# Patient Record
Sex: Female | Born: 1965
Health system: Southern US, Community
[De-identification: ages and names within clinical notes are randomized; demographics above are authoritative.]

## PROBLEM LIST (undated history)

## (undated) DIAGNOSIS — I10 Essential (primary) hypertension: Secondary | ICD-10-CM

## (undated) HISTORY — PX: TONSILLECTOMY: SUR1361

## (undated) HISTORY — DX: Essential (primary) hypertension: I10

## (undated) HISTORY — PX: ELBOW SURGERY: SHX618

---

## 2001-03-01 ENCOUNTER — Other Ambulatory Visit: Admission: RE | Admit: 2001-03-01 | Discharge: 2001-03-01 | Payer: Self-pay | Admitting: Obstetrics and Gynecology

## 2001-03-04 ENCOUNTER — Encounter: Payer: Self-pay | Admitting: Obstetrics and Gynecology

## 2001-03-04 ENCOUNTER — Ambulatory Visit (HOSPITAL_COMMUNITY): Admission: RE | Admit: 2001-03-04 | Discharge: 2001-03-04 | Payer: Self-pay | Admitting: Obstetrics and Gynecology

## 2002-03-04 ENCOUNTER — Ambulatory Visit (HOSPITAL_COMMUNITY): Admission: RE | Admit: 2002-03-04 | Discharge: 2002-03-04 | Payer: Self-pay | Admitting: Obstetrics and Gynecology

## 2002-03-04 ENCOUNTER — Encounter: Payer: Self-pay | Admitting: Obstetrics and Gynecology

## 2002-12-13 ENCOUNTER — Ambulatory Visit (HOSPITAL_COMMUNITY): Admission: RE | Admit: 2002-12-13 | Discharge: 2002-12-13 | Payer: Self-pay | Admitting: Family Medicine

## 2002-12-14 ENCOUNTER — Ambulatory Visit (HOSPITAL_COMMUNITY): Admission: RE | Admit: 2002-12-14 | Discharge: 2002-12-14 | Payer: Self-pay | Admitting: Family Medicine

## 2002-12-21 ENCOUNTER — Ambulatory Visit (HOSPITAL_COMMUNITY): Admission: RE | Admit: 2002-12-21 | Discharge: 2002-12-21 | Payer: Self-pay | Admitting: Family Medicine

## 2004-10-18 ENCOUNTER — Ambulatory Visit (HOSPITAL_COMMUNITY): Admission: RE | Admit: 2004-10-18 | Discharge: 2004-10-18 | Payer: Self-pay | Admitting: Obstetrics and Gynecology

## 2005-10-23 ENCOUNTER — Ambulatory Visit (HOSPITAL_COMMUNITY): Admission: RE | Admit: 2005-10-23 | Discharge: 2005-10-23 | Payer: Self-pay | Admitting: Family Medicine

## 2006-10-01 ENCOUNTER — Ambulatory Visit (HOSPITAL_COMMUNITY): Admission: RE | Admit: 2006-10-01 | Discharge: 2006-10-01 | Payer: Self-pay | Admitting: Family Medicine

## 2006-10-26 ENCOUNTER — Ambulatory Visit (HOSPITAL_COMMUNITY): Admission: RE | Admit: 2006-10-26 | Discharge: 2006-10-26 | Payer: Self-pay | Admitting: Obstetrics and Gynecology

## 2007-10-27 ENCOUNTER — Ambulatory Visit (HOSPITAL_COMMUNITY): Admission: RE | Admit: 2007-10-27 | Discharge: 2007-10-27 | Payer: Self-pay | Admitting: Family Medicine

## 2007-12-28 ENCOUNTER — Other Ambulatory Visit: Admission: RE | Admit: 2007-12-28 | Discharge: 2007-12-28 | Payer: Self-pay | Admitting: Obstetrics and Gynecology

## 2008-11-01 ENCOUNTER — Ambulatory Visit (HOSPITAL_COMMUNITY): Admission: RE | Admit: 2008-11-01 | Discharge: 2008-11-01 | Payer: Self-pay | Admitting: Family Medicine

## 2009-11-12 ENCOUNTER — Ambulatory Visit (HOSPITAL_COMMUNITY): Admission: RE | Admit: 2009-11-12 | Discharge: 2009-11-12 | Payer: Self-pay | Admitting: Family Medicine

## 2010-06-07 NOTE — Procedures (Signed)
NAME:  Vanessa Rojas, Vanessa Rojas                         ACCOUNT NO.:  [id number]   MEDICAL RECORD NO.:  [id number]                   PATIENT TYPE:  OUT   LOCATION:  [facility]                                 FACILITY:  [facility]   PHYSICIAN:  Scott A. Gerda Diss, M.D.               DATE OF BIRTH:  [birth year]   DATE OF PROCEDURE:  12/13/2002  DATE OF DISCHARGE:                                    STRESS TEST   INDICATIONS FOR PROCEDURE:  Chest discomfort.   PROTOCOL:  Bruce.   FINDINGS:  Resting EKG. No acute ST segment changes are noted.  Heart rate  response to exercise - gradual increase in heart rate. The patient was able  to go 2 minutes into stage 3, reaching a peak heart rate of 145. There were  no acute ST segment changes noted with that heart rate. No arrhythmias.   SYMPTOMATOLOGY:  Shortness of breath.   RECOVERY PHASE:  Uneventful.   BLOOD PRESSURE RESPONSE:  Hypertensive response.   INTERPRETATION:  Negative stress test - Subpar exercise tolerance. The  patient really feels that this has kicked in over the past couple of weeks.  She does not feel that she was this way before that and feels there is  something more wrong. Therefore, will pursue doing further tests including  pulmonary function and spiral CT to rule out pulmonary embolism.      ___________________________________________                                            Jonna Coup Gerda Diss, M.D.   SAL/MEDQ  D:  12/13/2002  T:  12/13/2002  Job:  [id number]

## 2010-06-07 NOTE — Procedures (Signed)
NAME:  Vanessa Rojas, Vanessa Rojas                         ACCOUNT NO.:  [id number]   MEDICAL RECORD NO.:  [id number]                   PATIENT TYPE:  OUT   LOCATION:  [facility]                                 FACILITY:  [facility]   PHYSICIAN:  Edward L. Juanetta Gosling, M.D.             DATE OF BIRTH:  1965/08/05   DATE OF PROCEDURE:  DATE OF DISCHARGE:                              PULMONARY FUNCTION TEST   FINDINGS:  1. Spirometry shows no ventilatory defect and a very slight decrease in flow     between 25 and 75% of the forced vital capacity.  This can indicate air     flow obstruction in the smaller airways.  2. Lung volumes showed no evidence of a restrictive change, but do show some     air trapping.  3. DLCO is mildly reduced.      ___________________________________________                                            Oneal Deputy. Juanetta Gosling, M.D.   ELH/MEDQ  D:  12/21/2002  T:  12/21/2002  Job:  [id number]   cc:   Lorin Picket A. Gerda Diss, M.D.  [address]  [place]  [place] [address]  Fax: [phone]

## 2012-05-06 ENCOUNTER — Other Ambulatory Visit: Payer: Self-pay | Admitting: *Deleted

## 2012-05-06 MED ORDER — METOPROLOL TARTRATE 100 MG PO TABS: 50.0000 mg | ORAL_TABLET | Freq: Two times a day (BID) | ORAL | Status: DC

## 2012-06-08 ENCOUNTER — Ambulatory Visit (INDEPENDENT_AMBULATORY_CARE_PROVIDER_SITE_OTHER): Payer: BC Managed Care – PPO | Admitting: Family Medicine

## 2012-06-08 ENCOUNTER — Encounter: Payer: Self-pay | Admitting: Family Medicine

## 2012-06-08 VITALS — BP 130/80 | HR 80 | Wt 147.2 lb

## 2012-06-08 DIAGNOSIS — R5381 Other malaise: Secondary | ICD-10-CM

## 2012-06-08 DIAGNOSIS — R5383 Other fatigue: Secondary | ICD-10-CM

## 2012-06-08 DIAGNOSIS — Z0000 Encounter for general adult medical examination without abnormal findings: Secondary | ICD-10-CM

## 2012-06-08 DIAGNOSIS — I10 Essential (primary) hypertension: Secondary | ICD-10-CM

## 2012-06-08 MED ORDER — METOPROLOL TARTRATE 100 MG PO TABS: 50.0000 mg | ORAL_TABLET | Freq: Two times a day (BID) | ORAL | Status: DC

## 2012-06-08 NOTE — Progress Notes (Signed)
  Subjective:    Patient ID: Vanessa Rojas, female    DOB: 1966-01-14, 47 y.o.   MRN: [id number]  Hypertension This is a chronic problem. The current episode started more than 1 year ago. The problem is controlled. Pertinent negatives include no chest pain or shortness of breath. There are no associated agents to hypertension. There are no known risk factors for coronary artery disease. Past treatments include beta blockers and ACE inhibitors. The current treatment provides mild improvement. There are no compliance problems.  There is no history of angina, CAD/MI, CVA or a thyroid problem. There is no history of chronic renal disease.  diet- weight watchers     Review of Systems  Constitutional: Negative for activity change and appetite change.  HENT: Negative for congestion.   Respiratory: Negative for chest tightness and shortness of breath.   Cardiovascular: Negative for chest pain.       Objective:   Physical Exam  Vitals reviewed. Constitutional: She appears well-developed.  HENT:  Head: Normocephalic.  Neck: Normal range of motion.  Cardiovascular: Normal rate, regular rhythm and normal heart sounds.   Pulmonary/Chest: Effort normal and breath sounds normal.  Lymphadenopathy:    She has no cervical adenopathy.  Skin: She is not diaphoretic.          Assessment & Plan:  HTN- good control, continue as is, labs needed, diet discussed, smoking cessation counseled,recheck 1 year

## 2012-07-12 LAB — BASIC METABOLIC PANEL
BUN: 7 mg/dL (ref 6–23)
CO2: 28 mEq/L (ref 19–32)
Calcium: 9.4 mg/dL (ref 8.4–10.5)
Chloride: 105 mEq/L (ref 96–112)
Creat: 0.69 mg/dL (ref 0.50–1.10)
Sodium: 139 mEq/L (ref 135–145)

## 2012-07-12 LAB — LIPID PANEL
HDL: 36 mg/dL — ABNORMAL LOW (ref >39)
Total CHOL/HDL Ratio: 4.9 Ratio
VLDL: 24 mg/dL (ref 0–40)

## 2012-07-12 LAB — TSH: TSH: 3.947 u[IU]/mL (ref 0.350–4.500)

## 2012-07-13 ENCOUNTER — Encounter: Payer: Self-pay | Admitting: Family Medicine

## 2012-07-21 ENCOUNTER — Telehealth: Payer: Self-pay | Admitting: Family Medicine

## 2012-07-21 NOTE — Telephone Encounter (Signed)
Enc date 07/13/12 - letter printed & mailed 07/22/12

## 2012-11-25 ENCOUNTER — Other Ambulatory Visit: Payer: Self-pay

## 2013-09-22 ENCOUNTER — Other Ambulatory Visit: Payer: Self-pay | Admitting: Family Medicine

## 2013-12-07 ENCOUNTER — Other Ambulatory Visit: Payer: Self-pay | Admitting: Family Medicine

## 2013-12-07 MED ORDER — METOPROLOL TARTRATE 100 MG PO TABS
ORAL_TABLET | ORAL | Status: DC
Start: 2013-12-07 — End: 2013-12-12

## 2013-12-07 NOTE — Telephone Encounter (Signed)
Patient needs Rx for metoprolol (LOPRESSOR) 100 MG tablet to [facility].

## 2013-12-07 NOTE — Telephone Encounter (Signed)
Rx sent electronically into pharmacy. Patient notified.

## 2013-12-07 NOTE — Telephone Encounter (Signed)
She has an appointment on 12/12/13.

## 2013-12-12 ENCOUNTER — Encounter: Payer: Self-pay | Admitting: Family Medicine

## 2013-12-12 ENCOUNTER — Ambulatory Visit (INDEPENDENT_AMBULATORY_CARE_PROVIDER_SITE_OTHER): Payer: BC Managed Care – PPO | Admitting: Family Medicine

## 2013-12-12 VITALS — BP 140/86 | Ht 60.0 in | Wt 150.6 lb

## 2013-12-12 DIAGNOSIS — E785 Hyperlipidemia, unspecified: Secondary | ICD-10-CM

## 2013-12-12 DIAGNOSIS — I10 Essential (primary) hypertension: Secondary | ICD-10-CM

## 2013-12-12 MED ORDER — AMLODIPINE BESYLATE 5 MG PO TABS
5.0000 mg | ORAL_TABLET | Freq: Every day | ORAL | Status: DC
Start: 2013-12-12 — End: 2013-12-19

## 2013-12-12 NOTE — Progress Notes (Signed)
   Subjective:    Patient ID: Vanessa Rojas, female    DOB: 08-12-1965, 48 y.o.   MRN: [id number]  Hypertension This is a chronic problem. The current episode started more than 1 year ago. Risk factors for coronary artery disease include smoking/tobacco exposure. Treatments tried: metoprolol. There are no compliance problems.       Review of Systems  denies chest tightness pressure pain shortness breath nausea vomiting    Objective:   Physical Exam  neck no masses Lungs clear no crackles Heart regular pulse normal blood pressure elevated extremities no edema       Assessment & Plan:   patient counseled to quit smoking Hypertensive control subparStop metoprolol she is only using it sporadicallyAmlodipine 5 mghalf a tablet each morning send [place] some blood pressure readings within a few weeks may need to go to a full tablet. Call us if any problems Metabolic 7 lipid profile recommended

## 2013-12-12 NOTE — Patient Instructions (Addendum)
DASH Eating Plan DASH stands for "Dietary Approaches to Stop Hypertension." The DASH eating plan is a healthy eating plan that has been shown to reduce high blood pressure (hypertension). Additional health benefits may include reducing the risk of type 2 diabetes mellitus, heart disease, and stroke. The DASH eating plan may also help with weight loss. WHAT DO I NEED TO KNOW ABOUT THE DASH EATING PLAN? For the DASH eating plan, you will follow these general guidelines:  Choose foods with a percent daily value for sodium of less than 5% (as listed on the food label).  Use salt-free seasonings or herbs instead of table salt or sea salt.  Check with your health care provider or pharmacist before using salt substitutes.  Eat lower-sodium products, often labeled as "lower sodium" or "no salt added."  Eat fresh foods.  Eat more vegetables, fruits, and low-fat dairy products.  Choose whole grains. Look for the word "whole" as the first word in the ingredient list.  Choose fish and skinless chicken or turkey more often than red meat. Limit fish, poultry, and meat to 6 oz (170 g) each day.  Limit sweets, desserts, sugars, and sugary drinks.  Choose heart-healthy fats.  Limit cheese to 1 oz (28 g) per day.  Eat more home-cooked food and less restaurant, buffet, and fast food.  Limit fried foods.  Cook foods using methods other than frying.  Limit canned vegetables. If you do use them, rinse them well to decrease the sodium.  When eating at a restaurant, ask that your food be prepared with less salt, or no salt if possible. WHAT FOODS CAN I EAT? Seek help from a dietitian for individual calorie needs. Grains Whole grain or whole wheat bread. Brown rice. Whole grain or whole wheat pasta. Quinoa, bulgur, and whole grain cereals. Low-sodium cereals. Corn or whole wheat flour tortillas. Whole grain cornbread. Whole grain crackers. Low-sodium crackers. Vegetables Fresh or frozen vegetables  (raw, steamed, roasted, or grilled). Low-sodium or reduced-sodium tomato and vegetable juices. Low-sodium or reduced-sodium tomato sauce and paste. Low-sodium or reduced-sodium canned vegetables.  Fruits All fresh, canned (in natural juice), or frozen fruits. Meat and Other Protein Products Ground beef (85% or leaner), grass-fed beef, or beef trimmed of fat. Skinless chicken or turkey. Ground chicken or turkey. Pork trimmed of fat. All fish and seafood. Eggs. Dried beans, peas, or lentils. Unsalted nuts and seeds. Unsalted canned beans. Dairy Low-fat dairy products, such as skim or 1% milk, 2% or reduced-fat cheeses, low-fat ricotta or cottage cheese, or plain low-fat yogurt. Low-sodium or reduced-sodium cheeses. Fats and Oils Tub margarines without trans fats. Light or reduced-fat mayonnaise and salad dressings (reduced sodium). Avocado. Safflower, olive, or canola oils. Natural peanut or almond butter. Other Unsalted popcorn and pretzels. The items listed above may not be a complete list of recommended foods or beverages. Contact your dietitian for more options. WHAT FOODS ARE NOT RECOMMENDED? Grains White bread. White pasta. White rice. Refined cornbread. Bagels and croissants. Crackers that contain trans fat. Vegetables Creamed or fried vegetables. Vegetables in a cheese sauce. Regular canned vegetables. Regular canned tomato sauce and paste. Regular tomato and vegetable juices. Fruits Dried fruits. Canned fruit in light or heavy syrup. Fruit juice. Meat and Other Protein Products Fatty cuts of meat. Ribs, chicken wings, bacon, sausage, bologna, salami, chitterlings, fatback, hot dogs, bratwurst, and packaged luncheon meats. Salted nuts and seeds. Canned beans with salt. Dairy Whole or 2% milk, cream, half-and-half, and cream cheese. Whole-fat or sweetened yogurt. Full-fat   cheeses or blue cheese. Nondairy creamers and whipped toppings. Processed cheese, cheese spreads, or cheese  curds. Condiments Onion and garlic salt, seasoned salt, table salt, and sea salt. Canned and packaged gravies. Worcestershire sauce. Tartar sauce. Barbecue sauce. Teriyaki sauce. Soy sauce, including reduced sodium. Steak sauce. Fish sauce. Oyster sauce. Cocktail sauce. Horseradish. Ketchup and mustard. Meat flavorings and tenderizers. Bouillon cubes. Hot sauce. Tabasco sauce. Marinades. Taco seasonings. Relishes. Fats and Oils Butter, stick margarine, lard, shortening, ghee, and bacon fat. Coconut, palm kernel, or palm oils. Regular salad dressings. Other Pickles and olives. Salted popcorn and pretzels. The items listed above may not be a complete list of foods and beverages to avoid. Contact your dietitian for more information. WHERE CAN I FIND MORE INFORMATION? National Heart, Lung, and [employer] Institute: www.nhlbi.nih.gov/health/health-topics/topics/dash/ Document Released: 12/26/2010 Document Revised: 05/23/2013 Document Reviewed: 11/10/2012 [employer] Patient Information 2015 [employer]. This information is not intended to replace advice given to you by your health care provider. Make sure you discuss any questions you have with your health care provider. Smoking Cessation Quitting smoking is important to your health and has many advantages. However, it is not always easy to quit since nicotine is a very addictive drug. Oftentimes, people try 3 times or more before being able to quit. This document explains the best ways for you to prepare to quit smoking. Quitting takes hard work and a lot of effort, but you can do it. ADVANTAGES OF QUITTING SMOKING  You will live longer, feel better, and live better.  Your body will feel the impact of quitting smoking almost immediately.  Within 20 minutes, blood pressure decreases. Your pulse returns to its normal level.  After 8 hours, carbon monoxide levels in the blood return to normal. Your oxygen level increases.  After 24 hours, the chance of  having a heart attack starts to decrease. Your breath, hair, and body stop smelling like smoke.  After 48 hours, damaged nerve endings begin to recover. Your sense of taste and smell improve.  After 72 hours, the body is virtually free of nicotine. Your bronchial tubes relax and breathing becomes easier.  After 2 to 12 weeks, lungs can hold more air. Exercise becomes easier and circulation improves.  The risk of having a heart attack, stroke, cancer, or lung disease is greatly reduced.  After 1 year, the risk of coronary heart disease is cut in half.  After 5 years, the risk of stroke falls to the same as a nonsmoker.  After 10 years, the risk of lung cancer is cut in half and the risk of other cancers decreases significantly.  After 15 years, the risk of coronary heart disease drops, usually to the level of a nonsmoker.  If you are pregnant, quitting smoking will improve your chances of having a healthy baby.  The people you live with, especially any children, will be healthier.  You will have extra money to spend on things other than cigarettes. QUESTIONS TO THINK ABOUT BEFORE ATTEMPTING TO QUIT You may want to talk about your answers with your health care provider.  Why do you want to quit?  If you tried to quit in the past, what helped and what did not?  What will be the most difficult situations for you after you quit? How will you plan to handle them?  Who can help you through the tough times? Your family? Friends? A health care provider?  What pleasures do you get from smoking? What ways can you still get pleasure   if you quit? Here are some questions to ask your health care provider:  How can you help me to be successful at quitting?  What medicine do you think would be best for me and how should I take it?  What should I do if I need more help?  What is smoking withdrawal like? How can I get information on withdrawal? GET READY  Set a quit date.  Change your  environment by getting rid of all cigarettes, ashtrays, matches, and lighters in your home, car, or work. Do not let people smoke in your home.  Review your past attempts to quit. Think about what worked and what did not. GET SUPPORT AND ENCOURAGEMENT You have a better chance of being successful if you have help. You can get support in many ways.  Tell your family, friends, and coworkers that you are going to quit and need their support. Ask them not to smoke around you.  Get individual, group, or telephone counseling and support. Programs are available at local hospitals and health centers. Call your local health department for information about programs in your area.  Spiritual beliefs and practices may help some smokers quit.  Download a "quit meter" on your computer to keep track of quit statistics, such as how long you have gone without smoking, cigarettes not smoked, and money saved.  Get a self-help book about quitting smoking and staying off tobacco. LEARN NEW SKILLS AND BEHAVIORS  Distract yourself from urges to smoke. Talk to someone, go for a walk, or occupy your time with a task.  Change your normal routine. Take a different route to work. Drink tea instead of coffee. Eat breakfast in a different place.  Reduce your stress. Take a hot bath, exercise, or read a book.  Plan something enjoyable to do every day. Reward yourself for not smoking.  Explore interactive web-based programs that specialize in helping you quit. GET MEDICINE AND USE IT CORRECTLY Medicines can help you stop smoking and decrease the urge to smoke. Combining medicine with the above behavioral methods and support can greatly increase your chances of successfully quitting smoking.  Nicotine replacement therapy helps deliver nicotine to your body without the negative effects and risks of smoking. Nicotine replacement therapy includes nicotine gum, lozenges, inhalers, nasal sprays, and skin patches. Some may be  available over-the-counter and others require a prescription.  Antidepressant medicine helps people abstain from smoking, but how this works is unknown. This medicine is available by prescription.  Nicotinic receptor partial agonist medicine simulates the effect of nicotine in your brain. This medicine is available by prescription. Ask your health care provider for advice about which medicines to use and how to use them based on your health history. Your health care provider will tell you what side effects to look out for if you choose to be on a medicine or therapy. Carefully read the information on the package. Do not use any other product containing nicotine while using a nicotine replacement product.  RELAPSE OR DIFFICULT SITUATIONS Most relapses occur within the first 3 months after quitting. Do not be discouraged if you start smoking again. Remember, most people try several times before finally quitting. You may have symptoms of withdrawal because your body is used to nicotine. You may crave cigarettes, be irritable, feel very hungry, cough often, get headaches, or have difficulty concentrating. The withdrawal symptoms are only temporary. They are strongest when you first quit, but they will go away within 10-14 days. To reduce the   chances of relapse, try to:  Avoid drinking alcohol. Drinking lowers your chances of successfully quitting.  Reduce the amount of caffeine you consume. Once you quit smoking, the amount of caffeine in your body increases and can give you symptoms, such as a rapid heartbeat, sweating, and anxiety.  Avoid smokers because they can make you want to smoke.  Do not let weight gain distract you. Many smokers will gain weight when they quit, usually less than 10 pounds. Eat a healthy diet and stay active. You can always lose the weight gained after you quit.  Find ways to improve your mood other than smoking. FOR MORE INFORMATION  www.smokefree.gov  Document Released:  12/31/2000 Document Revised: 05/23/2013 Document Reviewed: 04/17/2011 [facility] Patient Information 2015 [place], [place]. This information is not intended to replace advice given to you by your health care provider. Make sure you discuss any questions you have with your health care provider. Smoking Cessation Quitting smoking is important to your health and has many advantages. However, it is not always easy to quit since nicotine is a very addictive drug. Oftentimes, people try 3 times or more before being able to quit. This document explains the best ways for you to prepare to quit smoking. Quitting takes hard work and a lot of effort, but you can do it. ADVANTAGES OF QUITTING SMOKING  You will live longer, feel better, and live better.  Your body will feel the impact of quitting smoking almost immediately.  Within 20 minutes, blood pressure decreases. Your pulse returns to its normal level.  After 8 hours, carbon monoxide levels in the blood return to normal. Your oxygen level increases.  After 24 hours, the chance of having a heart attack starts to decrease. Your breath, hair, and body stop smelling like smoke.  After 48 hours, damaged nerve endings begin to recover. Your sense of taste and smell improve.  After 72 hours, the body is virtually free of nicotine. Your bronchial tubes relax and breathing becomes easier.  After 2 to 12 weeks, lungs can hold more air. Exercise becomes easier and circulation improves.  The risk of having a heart attack, stroke, cancer, or lung disease is greatly reduced.  After 1 year, the risk of coronary heart disease is cut in half.  After 5 years, the risk of stroke falls to the same as a nonsmoker.  After 10 years, the risk of lung cancer is cut in half and the risk of other cancers decreases significantly.  After 15 years, the risk of coronary heart disease drops, usually to the level of a nonsmoker.  If you are pregnant, quitting smoking will  improve your chances of having a healthy baby.  The people you live with, especially any children, will be healthier.  You will have extra money to spend on things other than cigarettes. QUESTIONS TO THINK ABOUT BEFORE ATTEMPTING TO QUIT You may want to talk about your answers with your health care provider.  Why do you want to quit?  If you tried to quit in the past, what helped and what did not?  What will be the most difficult situations for you after you quit? How will you plan to handle them?  Who can help you through the tough times? Your family? Friends? A health care provider?  What pleasures do you get from smoking? What ways can you still get pleasure if you quit? Here are some questions to ask your health care provider:  How can you help me to be  successful at quitting?  What medicine do you think would be best for me and how should I take it?  What should I do if I need more help?  What is smoking withdrawal like? How can I get information on withdrawal? GET READY  Set a quit date.  Change your environment by getting rid of all cigarettes, ashtrays, matches, and lighters in your home, car, or work. Do not let people smoke in your home.  Review your past attempts to quit. Think about what worked and what did not. GET SUPPORT AND ENCOURAGEMENT You have a better chance of being successful if you have help. You can get support in many ways.  Tell your family, friends, and coworkers that you are going to quit and need their support. Ask them not to smoke around you.  Get individual, group, or telephone counseling and support. Programs are available at [employer] and health centers. Call your local health department for information about programs in your area.  Spiritual beliefs and practices may help some smokers quit.  Download a "quit meter" on your computer to keep track of quit statistics, such as how long you have gone without smoking, cigarettes not smoked,  and money saved.  Get a self-help book about quitting smoking and staying off tobacco. [place] yourself from urges to smoke. Talk to someone, go for a walk, or occupy your time with a task.  Change your normal routine. Take a different route to work. Drink tea instead of coffee. Eat breakfast in a different place.  Reduce your stress. Take a hot bath, exercise, or read a book.  Plan something enjoyable to do every day. Reward yourself for not smoking.  Explore interactive web-based programs that specialize in helping you quit. GET MEDICINE AND USE IT CORRECTLY Medicines can help you stop smoking and decrease the urge to smoke. Combining medicine with the above behavioral methods and support can greatly increase your chances of successfully quitting smoking.  Nicotine replacement therapy helps deliver nicotine to your body without the negative effects and risks of smoking. Nicotine replacement therapy includes nicotine gum, lozenges, inhalers, nasal sprays, and skin patches. Some may be available over-the-counter and others require a prescription.  Antidepressant medicine helps people abstain from smoking, but how this works is unknown. This medicine is available by prescription.  Nicotinic receptor partial agonist medicine simulates the effect of nicotine in your brain. This medicine is available by prescription. Ask your health care provider for advice about which medicines to use and how to use them based on your health history. Your health care provider will tell you what side effects to look out for if you choose to be on a medicine or therapy. Carefully read the information on the package. Do not use any other product containing nicotine while using a nicotine replacement product.  RELAPSE OR DIFFICULT SITUATIONS Most relapses occur within the first 3 months after quitting. Do not be discouraged if you start smoking again. Remember, most people try  several times before finally quitting. You may have symptoms of withdrawal because your body is used to nicotine. You may crave cigarettes, be irritable, feel very hungry, cough often, get headaches, or have difficulty concentrating. The withdrawal symptoms are only temporary. They are strongest when you first quit, but they will go away within 10-14 days. To reduce the chances of relapse, try to:  Avoid drinking alcohol. Drinking lowers your chances of successfully quitting.  Reduce the amount of  caffeine you consume. Once you quit smoking, the amount of caffeine in your body increases and can give you symptoms, such as a rapid heartbeat, sweating, and anxiety.  Avoid smokers because they can make you want to smoke.  Do not let weight gain distract you. Many smokers will gain weight when they quit, usually less than 10 pounds. Eat a healthy diet and stay active. You can always lose the weight gained after you quit.  Find ways to improve your mood other than smoking. FOR MORE INFORMATION  www.smokefree.gov  Document Released: 12/31/2000 Document Revised: 05/23/2013 Document Reviewed: 04/17/2011 [facility] Patient Information 2015 [place], [place]. This information is not intended to replace advice given to you by your health care provider. Make sure you discuss any questions you have with your health care provider.

## 2013-12-13 ENCOUNTER — Encounter: Payer: Self-pay | Admitting: Family Medicine

## 2013-12-13 LAB — BASIC METABOLIC PANEL
BUN: 9 mg/dL (ref 6–23)
CALCIUM: 9.8 mg/dL (ref 8.4–10.5)
CHLORIDE: 105 meq/L (ref 96–112)
CO2: 31 meq/L (ref 19–32)
CREATININE: 0.65 mg/dL (ref 0.50–1.10)
GLUCOSE: 93 mg/dL (ref 70–99)
POTASSIUM: 5.3 meq/L (ref 3.5–5.3)
Sodium: 141 mEq/L (ref 135–145)

## 2013-12-13 LAB — LIPID PANEL
CHOL/HDL RATIO: 5.1 ratio
Cholesterol: 179 mg/dL (ref 0–200)
HDL: 35 mg/dL — AB (ref >39)
LDL Cholesterol: 116 mg/dL — ABNORMAL HIGH (ref 0–99)
TRIGLYCERIDES: 138 mg/dL (ref <150)
VLDL: 28 mg/dL (ref 0–40)

## 2013-12-16 ENCOUNTER — Encounter: Payer: Self-pay | Admitting: Family Medicine

## 2013-12-16 ENCOUNTER — Encounter: Payer: Self-pay | Admitting: *Deleted

## 2013-12-16 NOTE — Telephone Encounter (Signed)
Verita Lamb 12/16/2013 10:42 AM EST    ----- Message -----  From: Tonie Griffith  Sent: 12/16/2013 10:24 AM  To: Mychart Admin Subject: [id number] check your MyChart account   Hi Dr. Nicki Reaper, I have been taken the new medicine. I don't like they way it makes me feel. I feel nervous , agitated and feel my heart pump hard. My BP 12/13/13@6 :00 pm 135/90 P-104, 12/14/13 @11 :19am 164/96 P-106, 12/15/13 @ 4:00 pm 148/90 P 104 I'm taken 1 pill every morning. Thanks, Nance Pew ----- Message ----- From: MyChart Support Sent: 12/10/2012 8:01 PM EST To: Tonie Griffith Subject: Please check your MyChart account  Ms. Lampert, you have new information in MyChart, please log in to [email or web address] to check your information.

## 2013-12-18 NOTE — Telephone Encounter (Signed)
Please let the patient know that I reviewed over her concerns, we can try a different blood pressure medicine. Hopefully she will not have side effects with the knee medication. All blood pressure medicines run the chance of causing some side effects. There are limited number of medicines that can be tried but stop amlodipine. Start losartan 50 mg, 1 daily, #30, 3 refills. Patient to send [place] blood pressure readings in a few weeks time.

## 2013-12-19 ENCOUNTER — Other Ambulatory Visit: Payer: Self-pay

## 2013-12-19 MED ORDER — LOSARTAN POTASSIUM 50 MG PO TABS: 50.0000 mg | ORAL_TABLET | Freq: Every day | ORAL | Status: DC

## 2013-12-27 ENCOUNTER — Encounter: Payer: Self-pay | Admitting: Family Medicine

## 2013-12-27 MED ORDER — METOPROLOL TARTRATE 100 MG PO TABS: ORAL_TABLET | ORAL | Status: DC

## 2013-12-27 NOTE — Telephone Encounter (Signed)
#  1 please change her Epic medicine list to reflect metoprolol half tablet twice daily may stop losartan No. 2 inform the patient she needs to monitor her blood pressures periodically and follow-up in about 6 weeks with those readings. Office visit at that time #3 let the patient know that there is a possibility she may need to be on additional medications in order to get her blood pressure under control if not obtaining better control. She is to minimize salt in the diet stay physically active and to quit smoking all of those would help her blood pressure as well

## 2013-12-27 NOTE — Addendum Note (Signed)
Addended by: Dairl Ponder on: 12/27/2013 02:57 PM   Modules accepted: Orders, Medications

## 2013-12-27 NOTE — Telephone Encounter (Signed)
Med changed in Epic and patient notified.

## 2014-01-25 ENCOUNTER — Other Ambulatory Visit: Payer: Self-pay | Admitting: *Deleted

## 2014-01-25 MED ORDER — METOPROLOL TARTRATE 100 MG PO TABS: ORAL_TABLET | ORAL | Status: DC

## 2014-04-08 ENCOUNTER — Observation Stay (HOSPITAL_COMMUNITY)
Admission: EM | Admit: 2014-04-08 | Discharge: 2014-04-10 | Disposition: A | Discharge status: Home / Self Care | Payer: 59 | Attending: Internal Medicine | Admitting: Internal Medicine

## 2014-04-08 ENCOUNTER — Encounter (HOSPITAL_COMMUNITY): Payer: Self-pay | Admitting: Emergency Medicine

## 2014-04-08 ENCOUNTER — Emergency Department (HOSPITAL_COMMUNITY): Payer: 59

## 2014-04-08 DIAGNOSIS — Z880 Allergy status to penicillin: Secondary | ICD-10-CM | POA: Diagnosis not present

## 2014-04-08 DIAGNOSIS — I10 Essential (primary) hypertension: Secondary | ICD-10-CM | POA: Diagnosis not present

## 2014-04-08 DIAGNOSIS — Z720 Tobacco use: Secondary | ICD-10-CM | POA: Diagnosis not present

## 2014-04-08 DIAGNOSIS — R200 Anesthesia of skin: Secondary | ICD-10-CM | POA: Diagnosis present

## 2014-04-08 DIAGNOSIS — G459 Transient cerebral ischemic attack, unspecified: Secondary | ICD-10-CM | POA: Diagnosis not present

## 2014-04-08 DIAGNOSIS — I161 Hypertensive emergency: Secondary | ICD-10-CM

## 2014-04-08 LAB — RAPID URINE DRUG SCREEN, HOSP PERFORMED
Amphetamines: NOT DETECTED
BENZODIAZEPINES: NOT DETECTED
Barbiturates: NOT DETECTED
Cocaine: NOT DETECTED
OPIATES: NOT DETECTED
TETRAHYDROCANNABINOL: NOT DETECTED

## 2014-04-08 LAB — URINE MICROSCOPIC-ADD ON

## 2014-04-08 LAB — ETHANOL: Alcohol, Ethyl (B): <5 mg/dL (ref 0–9)

## 2014-04-08 LAB — COMPREHENSIVE METABOLIC PANEL
ALK PHOS: 77 U/L (ref 39–117)
ALT: 17 U/L (ref 0–35)
AST: 17 U/L (ref 0–37)
Albumin: 4.2 g/dL (ref 3.5–5.2)
Anion gap: 7 (ref 5–15)
BUN: 13 mg/dL (ref 6–23)
CO2: 28 mmol/L (ref 19–32)
CREATININE: 0.82 mg/dL (ref 0.50–1.10)
Calcium: 9.2 mg/dL (ref 8.4–10.5)
Chloride: 107 mmol/L (ref 96–112)
GFR calc Af Amer: >90 mL/min (ref >90)
GFR calc non Af Amer: 82 mL/min — ABNORMAL LOW (ref >90)
GLUCOSE: 93 mg/dL (ref 70–99)
Potassium: 3.6 mmol/L (ref 3.5–5.1)
Sodium: 142 mmol/L (ref 135–145)
TOTAL PROTEIN: 7 g/dL (ref 6.0–8.3)
Total Bilirubin: 0.4 mg/dL (ref 0.3–1.2)

## 2014-04-08 LAB — URINALYSIS, ROUTINE W REFLEX MICROSCOPIC
Bilirubin Urine: NEGATIVE
Glucose, UA: NEGATIVE mg/dL
Ketones, ur: NEGATIVE mg/dL
Leukocytes, UA: NEGATIVE
Nitrite: NEGATIVE
Protein, ur: NEGATIVE mg/dL
Urobilinogen, UA: 1 mg/dL (ref 0.0–1.0)
pH: 7 (ref 5.0–8.0)

## 2014-04-08 LAB — DIFFERENTIAL
Basophils Absolute: 0.1 10*3/uL (ref 0.0–0.1)
Basophils Relative: 0 % (ref 0–1)
EOS ABS: 0.3 10*3/uL (ref 0.0–0.7)
Eosinophils Relative: 3 % (ref 0–5)
LYMPHS PCT: 28 % (ref 12–46)
Lymphs Abs: 3.3 10*3/uL (ref 0.7–4.0)
Monocytes Absolute: 0.8 10*3/uL (ref 0.1–1.0)
Monocytes Relative: 7 % (ref 3–12)
NEUTROS PCT: 62 % (ref 43–77)
Neutro Abs: 7.4 10*3/uL (ref 1.7–7.7)

## 2014-04-08 LAB — I-STAT CHEM 8, ED
BUN: 12 mg/dL (ref 6–23)
CREATININE: 0.9 mg/dL (ref 0.50–1.10)
Calcium, Ion: 1.17 mmol/L (ref 1.12–1.23)
Chloride: 104 mmol/L (ref 96–112)
GLUCOSE: 92 mg/dL (ref 70–99)
HEMATOCRIT: 44 % (ref 36.0–46.0)
HEMOGLOBIN: 15 g/dL (ref 12.0–15.0)
Potassium: 3.6 mmol/L (ref 3.5–5.1)
Sodium: 142 mmol/L (ref 135–145)
TCO2: 23 mmol/L (ref 0–100)

## 2014-04-08 LAB — CBC
HEMATOCRIT: 42.2 % (ref 36.0–46.0)
Hemoglobin: 14.5 g/dL (ref 12.0–15.0)
MCH: 32.5 pg (ref 26.0–34.0)
MCHC: 34.4 g/dL (ref 30.0–36.0)
MCV: 94.6 fL (ref 78.0–100.0)
Platelets: 273 10*3/uL (ref 150–400)
RBC: 4.46 MIL/uL (ref 3.87–5.11)
RDW: 12.5 % (ref 11.5–15.5)
WBC: 11.8 10*3/uL — ABNORMAL HIGH (ref 4.0–10.5)

## 2014-04-08 LAB — PROTIME-INR
INR: 0.9 (ref 0.00–1.49)
PROTHROMBIN TIME: 12.2 s (ref 11.6–15.2)

## 2014-04-08 LAB — APTT: APTT: 27 s (ref 24–37)

## 2014-04-08 LAB — I-STAT TROPONIN, ED: TROPONIN I, POC: 0 ng/mL (ref 0.00–0.08)

## 2014-04-08 NOTE — H&P (Signed)
Patient's PCP: [name], MD  Chief Complaint: Right sided numbness and weakness  History of Present Illness: Vanessa Rojas is a 49 y.o. Caucasian female with no significant past medical history except hypertension and tobacco use who presents with the above complaints.  Patient reports that her symptoms started at 7 p.m. while she was at church.  She noted that she had right lip numbness, she went to drink something and noted right arm numbness and weakness.  She also had right leg numbness and weakness.  This lasted for less than 30 seconds.  She went home and checked her blood pressure which was 168/90.  At 745 p.m. she again had an episode on her right face, arm, and leg numbness and weakness which again lasted for less than a minute.  At 830 p.m. she presented to the emergency department, she again had another episode of right face, arm, and leg weakness which resolved in less than a minute.  Due to her symptoms, hospitalist service was asked to admit the patient for further care and management.  Head CT was negative.  She denies any recent fevers, chills, nausea, vomiting, chest pain, shortness of breath, abdominal pain, diarrhea, headaches or vision changes.  Review of Systems: All systems reviewed with the patient and positive as per history of present illness, otherwise all other systems are negative.  Past Medical History  Diagnosis Date  . Hypertension    Past Surgical History  Procedure Laterality Date  . Cesarean section  july 2000  . Elbow surgery Right child hood   Family History  Problem Relation Age of Onset  . Cancer Mother     lung, liver, breast  . Cancer Father   . Hyperlipidemia Father   . Hypertension Father   . Cancer Brother     colon  . Diabetes Maternal Aunt   . Diabetes Maternal Grandmother    History   Social History  . Marital Status: Married    Spouse Name: N/A  . Number of Children: N/A  . Years of Education: N/A   Occupational History  .  Not on file.   Social History Main Topics  . Smoking status: Current Every Day Smoker -- 1.00 packs/day  . Smokeless tobacco: Not on file  . Alcohol Use: No  . Drug Use: No  . Sexual Activity: Not on file   Other Topics Concern  . Not on file   Social History Narrative   Allergies: Penicillins  Home Meds: Prior to Admission medications   Medication Sig Start Date End Date Taking? Authorizing Provider  metoprolol (LOPRESSOR) 100 MG tablet TAKE (1/2) TABLET BY MOUTH TWICE DAILY. 01/25/14  Yes Kathyrn Drown, MD    Physical Exam: Blood pressure 152/76, pulse 60, temperature 98.2 F (36.8 C), temperature source Oral, resp. rate 26, height 5' (1.524 m), weight 68.04 kg (150 lb), last menstrual period 05/09/2012, SpO2 97 %. General: Awake, Oriented x3, No acute distress. HEENT: EOMI, Moist mucous membranes Neck: Supple CV: S1 and S2, regular Lungs: Clear to ascultation bilaterally Abdomen: Soft, Nontender, Nondistended, +bowel sounds. Ext: Good pulses. Trace edema. No clubbing or cyanosis noted. Neuro: Cranial Nerves II-XII grossly intact. Has 5/5 motor strength in upper and lower extremities.   Lab results:  Recent Labs  04/08/14 2115 04/08/14 2133  NA 142 142  K 3.6 3.6  CL 107 104  CO2 28  --   GLUCOSE 93 92  BUN 13 12  CREATININE 0.82 0.90  CALCIUM 9.2  --  Recent Labs  04/08/14 2115  AST 17  ALT 17  ALKPHOS 77  BILITOT 0.4  PROT 7.0  ALBUMIN 4.2   No results for input(s): LIPASE, AMYLASE in the last 72 hours.  Recent Labs  04/08/14 2115 04/08/14 2133  WBC 11.8*  --   NEUTROABS 7.4  --   HGB 14.5 15.0  HCT 42.2 44.0  MCV 94.6  --   PLT 273  --    No results for input(s): CKTOTAL, CKMB, CKMBINDEX, TROPONINI in the last 72 hours. Invalid input(s): POCBNP No results for input(s): DDIMER in the last 72 hours. No results for input(s): HGBA1C in the last 72 hours. No results for input(s): CHOL, HDL, LDLCALC, TRIG, CHOLHDL, LDLDIRECT in the last  72 hours. No results for input(s): TSH, T4TOTAL, T3FREE, THYROIDAB in the last 72 hours.  Invalid input(s): FREET3 No results for input(s): VITAMINB12, FOLATE, FERRITIN, TIBC, IRON, RETICCTPCT in the last 72 hours. Imaging results:  Ct Head Wo Contrast  04/08/2014   CLINICAL DATA:  Right-sided facial numbness and upper extremity paresthesias, 5-10 minute duration.  EXAM: CT HEAD WITHOUT CONTRAST  TECHNIQUE: Contiguous axial images were obtained from the base of the skull through the vertex without intravenous contrast.  COMPARISON:  None.  FINDINGS: There is no intracranial hemorrhage, mass or evidence of acute infarction. Gray matter and white matter are normal. The ventricles and basal cisterns appear unremarkable.  The bony structures are intact. The visible portions of the paranasal sinuses are clear.  IMPRESSION: Normal brain   Electronically Signed   By: Andreas Newport M.D.   On: 04/08/2014 22:00   Other results: EKG: Sinus with heart rate of 59.  Assessment & Plan by Problem: Right face, arm, and leg weakness and numbness likely due to TIA, symptoms resolved Head CT negative.  Admit the patient to telemetry.  Initiate TIA workup, request MRI of the brain, carotid Dopplers, 2-D echocardiogram for further evaluation.  Start patient on aspirin.  Request neurochecks.  Allow permissive hypertension for the next 24 hours, control blood pressure of systolic blood pressures greater than 180.  Gently hydrate the patient on IV fluids to allow for improved brain perfusion.  Check hemoglobin A1c and lipid panel in the morning to risk stratify the patient.  Uncontrolled hypertension Management as indicated above.  Continue metoprolol.  When necessary hydralazine for systolic blood pressure greater than 180.  Tobacco abuse Patient counseled on cessation.  Patient declining to keep patch.  Prophylaxis Lovenox.  CODE STATUS Full code.  Disposition Admit the patient to telemetry as  observation.  Time spent on admission, talking to the patient, and coordinating care was: 50 mins.  Tymia Streb A, MD 04/08/2014, 11:18 PM

## 2014-04-08 NOTE — ED Notes (Signed)
Patient reports took 50 mg of metoprolol at approximately 1930 due to blood pressure being elevated.

## 2014-04-08 NOTE — ED Notes (Signed)
Patient reports she started having spells of numbness to right side of mouth, right arm, and right leg. States she was standing and had to sit down because she could hardly stand up. Reports the numbness comes and goes, but does not last. Reports had trouble using her hand and could not grip anything when episodes of numbness occurred. Patient ambulatory in triage.

## 2014-04-08 NOTE — ED Provider Notes (Signed)
CSN: [id number]     Arrival date & time 04/08/14  2041 History   First MD Initiated Contact with Patient 04/08/14 2106     Chief Complaint  Patient presents with  . Numbness     (Consider location/radiation/quality/duration/timing/severity/associated sxs/prior Treatment) HPI Comments: The patient is a 49 year old female, she has a history of hypertension and tobacco use, she presents to the hospital after having several episodes of right-sided facial numbness, right arm numbness and right leg weakness and numbness which have occurred since approximately 7:00 PM. Her last episode occurred while she was in the waiting room and a prior episode at approximately 8:00 PM left her with right-sided leg weakness requiring her to drag her leg when she ambulated. She denies having these kind of symptoms in the past, she denies headaches, and denies any other symptoms. Currently her symptoms have totally resolved and she has no complaints at this time.  No fevers, chills, headache, sore throat, visual changes, neck pain, back pain, chest pain, abdominal pain, shortness of breath, cough, dysuria, diarrhea, rectal bleeding, swelling, rashes  The history is provided by the patient.    Past Medical History  Diagnosis Date  . Hypertension    Past Surgical History  Procedure Laterality Date  . Cesarean section  july 2000  . Elbow surgery Right child hood   Family History  Problem Relation Age of Onset  . Cancer Mother     lung, liver, breast  . Cancer Father   . Hyperlipidemia Father   . Hypertension Father   . Cancer Brother     colon  . Diabetes Maternal Aunt   . Diabetes Maternal Grandmother    History  Substance Use Topics  . Smoking status: Current Every Day Smoker -- 1.00 packs/day  . Smokeless tobacco: Not on file  . Alcohol Use: No   OB History    No data available     Review of Systems  All other systems reviewed and are negative.     Allergies  Penicillins  Home  Medications   Prior to Admission medications   Medication Sig Start Date End Date Taking? Authorizing Provider  metoprolol (LOPRESSOR) 100 MG tablet TAKE (1/2) TABLET BY MOUTH TWICE DAILY. 01/25/14  Yes Kathyrn Drown, MD   BP 145/53 mmHg  Pulse 71  Temp(Src) 98.5 F (36.9 C) (Oral)  Resp 18  Ht 5' (1.524 m)  Wt 154 lb 4.8 oz (69.99 kg)  BMI 30.13 kg/m2  SpO2 97%  LMP 05/09/2012 Physical Exam  Constitutional: She appears well-developed and well-nourished. No distress.  HENT:  Head: Normocephalic and atraumatic.  Mouth/Throat: Oropharynx is clear and moist. No oropharyngeal exudate.  Eyes: Conjunctivae and EOM are normal. Pupils are equal, round, and reactive to light. Right eye exhibits no discharge. Left eye exhibits no discharge. No scleral icterus.  Neck: Normal range of motion. Neck supple. No JVD present. No thyromegaly present.  Cardiovascular: Normal rate, regular rhythm, normal heart sounds and intact distal pulses.  Exam reveals no gallop and no friction rub.   No murmur heard. Pulmonary/Chest: Effort normal and breath sounds normal. No respiratory distress. She has no wheezes. She has no rales.  Abdominal: Soft. Bowel sounds are normal. She exhibits no distension and no mass. There is no tenderness.  Musculoskeletal: Normal range of motion. She exhibits no edema or tenderness.  Lymphadenopathy:    She has no cervical adenopathy.  Neurological: She is alert. Coordination normal.  Neurologic exam:  Speech clear, pupils equal round  reactive to light, extraocular movements intact  Normal peripheral visual fields Cranial nerves III through XII normal including no facial droop Follows commands, moves all extremities x4, normal strength to bilateral upper and lower extremities at all major muscle groups including grip Sensation normal to light touch and pinprick Coordination intact, no limb ataxia, finger-nose-finger normal Rapid alternating movements normal No pronator  drift Gait normal   Skin: Skin is warm and dry. No rash noted. No erythema.  Psychiatric: She has a normal mood and affect. Her behavior is normal.  Nursing note and vitals reviewed.   ED Course  Procedures (including critical care time) Labs Review Labs Reviewed  CBC - Abnormal; Notable for the following:    WBC 11.8 (*)    All other components within normal limits  COMPREHENSIVE METABOLIC PANEL - Abnormal; Notable for the following:    GFR calc non Af Amer 82 (*)    All other components within normal limits  URINALYSIS, ROUTINE W REFLEX MICROSCOPIC - Abnormal; Notable for the following:    Specific Gravity, Urine <1.005 (*)    Hgb urine dipstick SMALL (*)    All other components within normal limits  URINE MICROSCOPIC-ADD ON - Abnormal; Notable for the following:    Squamous Epithelial / LPF MANY (*)    Bacteria, UA MANY (*)    All other components within normal limits  LIPID PANEL - Abnormal; Notable for the following:    HDL 31 (*)    LDL Cholesterol 106 (*)    All other components within normal limits  GLUCOSE, CAPILLARY - Abnormal; Notable for the following:    Glucose-Capillary 100 (*)    All other components within normal limits  ETHANOL  PROTIME-INR  APTT  DIFFERENTIAL  URINE RAPID DRUG SCREEN (HOSP PERFORMED)  HEMOGLOBIN A1C  I-STAT CHEM 8, ED  I-STAT TROPOININ, ED  I-STAT TROPOININ, ED    Imaging Review Ct Head Wo Contrast  04/08/2014   CLINICAL DATA:  Right-sided facial numbness and upper extremity paresthesias, 5-10 minute duration.  EXAM: CT HEAD WITHOUT CONTRAST  TECHNIQUE: Contiguous axial images were obtained from the base of the skull through the vertex without intravenous contrast.  COMPARISON:  None.  FINDINGS: There is no intracranial hemorrhage, mass or evidence of acute infarction. Gray matter and white matter are normal. The ventricles and basal cisterns appear unremarkable.  The bony structures are intact. The visible portions of the paranasal  sinuses are clear.  IMPRESSION: Normal brain   Electronically Signed   By: Andreas Newport M.D.   On: 04/08/2014 22:00     EKG Interpretation   Date/Time:  Saturday April 08 2014 21:06:45 EDT Ventricular Rate:  59 PR Interval:  177 QRS Duration: 94 QT Interval:  448 QTC Calculation: 444 R Axis:   27 Text Interpretation:  Sinus rhythm Low voltage, precordial leads No old  tracing to compare Abnormal ekg Confirmed by Sabra Heck  MD, [place] ([id number]) on  04/08/2014 9:21:14 PM      MDM   Final diagnoses:  TIA (transient ischemic attack)    At this time the patient has a totally normal neurologic exam, vital signs are significant for hypertension, currently 159/92, no other acute findings on exam, appears to have transient ischemic attacks, there is concern as this has happened multiple times this evening. CT scan ordered, stroke workup ordered, she is not actively having symptoms that she is not activated as a code stroke.  Labs and CT unremarkable - the pt has had no  further episodes of her numbness / weakness etc in the ED - she has likely had TIA's and is at higer risk for recurrent or worsneing symptoms - will admit to hospital for ongoing and continued w/u.  D/W Dr. Reece Levy who will admit    Noemi Chapel, MD 04/09/14 [phone]

## 2014-04-08 NOTE — ED Notes (Signed)
Dr.reddy in room with patient for evaluation

## 2014-04-09 ENCOUNTER — Observation Stay (HOSPITAL_COMMUNITY): Payer: 59

## 2014-04-09 DIAGNOSIS — G459 Transient cerebral ischemic attack, unspecified: Secondary | ICD-10-CM

## 2014-04-09 DIAGNOSIS — G458 Other transient cerebral ischemic attacks and related syndromes: Secondary | ICD-10-CM

## 2014-04-09 LAB — LIPID PANEL
CHOLESTEROL: 161 mg/dL (ref 0–200)
HDL: 31 mg/dL — ABNORMAL LOW (ref >39)
LDL Cholesterol: 106 mg/dL — ABNORMAL HIGH (ref 0–99)
Total CHOL/HDL Ratio: 5.2 RATIO
Triglycerides: 119 mg/dL (ref <150)
VLDL: 24 mg/dL (ref 0–40)

## 2014-04-09 LAB — GLUCOSE, CAPILLARY
GLUCOSE-CAPILLARY: 100 mg/dL — AB (ref 70–99)
GLUCOSE-CAPILLARY: 100 mg/dL — AB (ref 70–99)
Glucose-Capillary: 112 mg/dL — ABNORMAL HIGH (ref 70–99)

## 2014-04-09 MED ORDER — ACETAMINOPHEN 325 MG PO TABS: 650.0000 mg | ORAL_TABLET | ORAL | Status: DC | PRN

## 2014-04-09 MED ORDER — HYDRALAZINE HCL 20 MG/ML IJ SOLN: 10.0000 mg | Freq: Four times a day (QID) | INTRAMUSCULAR | Status: DC | PRN

## 2014-04-09 MED ORDER — SODIUM CHLORIDE 0.9 % IV SOLN
INTRAVENOUS | Status: AC
  Administered 2014-04-09 (×2): via INTRAVENOUS

## 2014-04-09 MED ORDER — METOPROLOL TARTRATE 50 MG PO TABS
50.0000 mg | ORAL_TABLET | Freq: Two times a day (BID) | ORAL | Status: DC
  Administered 2014-04-09 – 2014-04-10 (×3): 50 mg via ORAL
  Filled 2014-04-09 (×3): qty 1

## 2014-04-09 MED ORDER — ASPIRIN 325 MG PO TABS
325.0000 mg | ORAL_TABLET | Freq: Every day | ORAL | Status: DC
  Administered 2014-04-09 – 2014-04-10 (×3): 325 mg via ORAL
  Filled 2014-04-09 (×3): qty 1

## 2014-04-09 MED ORDER — STROKE: EARLY STAGES OF RECOVERY BOOK
Freq: Once | Status: AC
  Administered 2014-04-10: 11:00:00
  Filled 2014-04-09: qty 1

## 2014-04-09 MED ORDER — ENOXAPARIN SODIUM 40 MG/0.4ML ~~LOC~~ SOLN
40.0000 mg | SUBCUTANEOUS | Status: DC
  Filled 2014-04-09 (×2): qty 0.4

## 2014-04-09 NOTE — Progress Notes (Signed)
UR completed 

## 2014-04-09 NOTE — Progress Notes (Signed)
Echocardiogram 2D Echocardiogram has been performed.  Doyle Askew 04/09/2014, 9:49 AM

## 2014-04-09 NOTE — Progress Notes (Signed)
[facility] PROGRESS NOTE  Vanessa Rojas YPP:[id number] DOB: 04/23/1965 DOA: 04/08/2014 PCP: [name], MD  Assessment/Plan: 1. R sided numbness and weakness 1. CT head unremarkable 2. 2d echo done, await results 3. Pending carotid dopplers and MRI 2. HTN 1. Continued on metoprolol 2. BP is much improved overnight 3. Tobacco abuse 1. Declines nicotine patch 4. DVT prophylaxis 1. Lovenox subQ  Code Status: Full Family Communication: Pt in room (indicate person spoken with, relationship, and if by phone, the number) Disposition Plan: Pending   Consultants:  none  Procedures:    Antibiotics:  none (indicate start date, and stop date if known)  HPI/Subjective: No acute events noted. Denies weakness or numbness currently. Eager to go home  Objective: Filed Vitals:   04/08/14 2300 04/09/14 0008 04/09/14 0344 04/09/14 1001  BP: 152/76 172/84 145/53 132/75  Pulse: 60 68 71 64  Temp:  98.1 F (36.7 C) 98.5 F (36.9 C) 98.1 F (36.7 C)  TempSrc:  Oral Oral Oral  Resp: 26 20 18 18   Height:  5' (1.524 m)    Weight:  69.99 kg (154 lb 4.8 oz)    SpO2: 97% 100% 97% 98%    Intake/Output Summary (Last 24 hours) at 04/09/14 1027 Last data filed at 04/09/14 0900  Gross per 24 hour  Intake 688.75 ml  Output      0 ml  Net 688.75 ml   Filed Weights   04/08/14 2052 04/09/14 0008  Weight: 68.04 kg (150 lb) 69.99 kg (154 lb 4.8 oz)    Exam:   General:  Awake, in nad  Cardiovascular: regular, s1, s2  Respiratory: normal resp effort, no wheezing  Abdomen: soft,nondistended  Musculoskeletal: perfused, no clubbing  Neuro: CN2-12 intact, strength/sensation intact throughout   Data Reviewed: Basic Metabolic Panel:  Recent Labs Lab 04/08/14 2115 04/08/14 2133  NA 142 142  K 3.6 3.6  CL 107 104  CO2 28  --   GLUCOSE 93 92  BUN 13 12  CREATININE 0.82 0.90  CALCIUM 9.2  --    Liver Function Tests:  Recent Labs Lab 04/08/14 2115  AST 17   ALT 17  ALKPHOS 77  BILITOT 0.4  PROT 7.0  ALBUMIN 4.2   No results for input(s): LIPASE, AMYLASE in the last 168 hours. No results for input(s): AMMONIA in the last 168 hours. CBC:  Recent Labs Lab 04/08/14 2115 04/08/14 2133  WBC 11.8*  --   NEUTROABS 7.4  --   HGB 14.5 15.0  HCT 42.2 44.0  MCV 94.6  --   PLT 273  --    Cardiac Enzymes: No results for input(s): CKTOTAL, CKMB, CKMBINDEX, TROPONINI in the last 168 hours. BNP (last 3 results) No results for input(s): BNP in the last 8760 hours.  ProBNP (last 3 results) No results for input(s): PROBNP in the last 8760 hours.  CBG:  Recent Labs Lab 04/09/14 0725  GLUCAP 100*    No results found for this or any previous visit (from the past 240 hour(s)).   Studies: Ct Head Wo Contrast  04/08/2014   CLINICAL DATA:  Right-sided facial numbness and upper extremity paresthesias, 5-10 minute duration.  EXAM: CT HEAD WITHOUT CONTRAST  TECHNIQUE: Contiguous axial images were obtained from the base of the skull through the vertex without intravenous contrast.  COMPARISON:  None.  FINDINGS: There is no intracranial hemorrhage, mass or evidence of acute infarction. Gray matter and white matter are normal. The ventricles and basal cisterns appear unremarkable.  The bony structures are intact. The visible portions of the paranasal sinuses are clear.  IMPRESSION: Normal brain   Electronically Signed   By: Andreas Newport M.D.   On: 04/08/2014 22:00    Scheduled Meds: .  stroke: mapping our early stages of recovery book   Does not apply Once  . aspirin  325 mg Oral Daily  . enoxaparin (LOVENOX) injection  40 mg Subcutaneous Q24H  . metoprolol  50 mg Oral BID   Continuous Infusions: . sodium chloride 75 mL/hr at 04/09/14 0058    Principal Problem:   TIA (transient ischemic attack) Active Problems:   Uncontrolled hypertension   Tobacco abuse   [facility], [facility] Pager [phone]. If 7PM-7AM, please  contact night-coverage at [email or web address], password [facility] 04/09/2014, 10:27 AM

## 2014-04-10 ENCOUNTER — Observation Stay (HOSPITAL_COMMUNITY): Payer: 59

## 2014-04-10 DIAGNOSIS — I161 Hypertensive emergency: Secondary | ICD-10-CM

## 2014-04-10 LAB — GLUCOSE, CAPILLARY: Glucose-Capillary: 109 mg/dL — ABNORMAL HIGH (ref 70–99)

## 2014-04-10 MED ORDER — HYDROCHLOROTHIAZIDE 12.5 MG PO TABS: 25.0000 mg | ORAL_TABLET | Freq: Every day | ORAL | Status: DC

## 2014-04-10 NOTE — Discharge Summary (Signed)
Physician Discharge Summary  Vanessa Rojas ZJQ:[id number] DOB: 06/25/1965 DOA: 04/08/2014  PCP: [name], MD  Admit date: 04/08/2014 Discharge date: 04/10/2014  Time spent: 20 minutes  Recommendations for Outpatient Follow-up:  1. Follow up with PCP in 1-2 weeks  Discharge Diagnoses:  Principal Problem:   Hypertensive emergency Active Problems:   Uncontrolled hypertension   Tobacco abuse   Discharge Condition: Stable  Diet recommendation: Heart healthy  Filed Weights   04/08/14 2052 04/09/14 0008  Weight: 68.04 kg (150 lb) 69.99 kg (154 lb 4.8 oz)    History of present illness:  Please see admit h and p from 3/19 for details. Briefly, pt presented with R sided numbness. She was admitted for further work up.  Hospital Course:  1. R sided numbness and weakness 1. CT head unremarkable 2. 2d echo done, unremarkable 3. Carotid dopplers without stenosis 4. MRI neg for acute infarct 5. The patient remained stable 6. Given presenting poorly controlled BP, consideration for numbness/weakness secondary to uncontrolled HTN 2. HTN 1. Continued on metoprolol 2. BP is much improved, but still suboptimal 3. HCTZ was added 4. Did not increase beta blocker dose secondary to HR in the 50-60's 3. Tobacco abuse 1. Declined nicotine patch 4. DVT prophylaxis 1. Lovenox subQ  Procedures:  CT head  MRI brain  Carotid dopplers  2d echo  Discharge Exam: Filed Vitals:   04/09/14 1001 04/09/14 1606 04/09/14 2134 04/10/14 0542  BP: 132/75 130/63 146/76 162/81  Pulse: 64 54 54 52  Temp: 98.1 F (36.7 C) 98.3 F (36.8 C) 97.9 F (36.6 C) 97.9 F (36.6 C)  TempSrc: Oral Oral Oral Oral  Resp: 18 18 20 18   Height:      Weight:      SpO2: 98% 98% 97% 99%    General: Awake, in nad Cardiovascular: regular, s1, s2 Respiratory: normal resp effort, no wheezing  Discharge Instructions     Medication List    TAKE these medications        hydrochlorothiazide 12.5 MG  tablet  Commonly known as:  HYDRODIURIL  Take 2 tablets (25 mg total) by mouth daily.     metoprolol 100 MG tablet  Commonly known as:  LOPRESSOR  TAKE (1/2) TABLET BY MOUTH TWICE DAILY.       Allergies  Allergen Reactions  . Penicillins Other (See Comments)    Childhood allergy.   Follow-up Information    Follow up with [name], MD On 04/17/2014.   Specialty:  Family Medicine   Why:  at 10:00 am   Contact information:   [address] [place] [address] [phone]        The results of significant diagnostics from this hospitalization (including imaging, microbiology, ancillary and laboratory) are listed below for reference.    Significant Diagnostic Studies: Ct Head Wo Contrast  04/08/2014   CLINICAL DATA:  Right-sided facial numbness and upper extremity paresthesias, 5-10 minute duration.  EXAM: CT HEAD WITHOUT CONTRAST  TECHNIQUE: Contiguous axial images were obtained from the base of the skull through the vertex without intravenous contrast.  COMPARISON:  None.  FINDINGS: There is no intracranial hemorrhage, mass or evidence of acute infarction. Gray matter and white matter are normal. The ventricles and basal cisterns appear unremarkable.  The bony structures are intact. The visible portions of the paranasal sinuses are clear.  IMPRESSION: Normal brain   Electronically Signed   By: Andreas Newport M.D.   On: 04/08/2014 22:00   Mr [facility]  Wo Contrast  04/10/2014   CLINICAL DATA:  Right-sided facial numbness and upper extremity paresthesia lasting 10 min. TIA.  EXAM: MRI HEAD WITHOUT CONTRAST  MRA HEAD WITHOUT CONTRAST  TECHNIQUE: Multiplanar, multiecho pulse sequences of the brain and surrounding structures were obtained without intravenous contrast. Angiographic images of the head were obtained using MRA technique without contrast.  COMPARISON:  Head CT 04/08/2014  FINDINGS: MRI HEAD FINDINGS  There is no evidence of acute infarct, intracranial  hemorrhage, mass, midline shift, or extra-axial fluid collection. Ventricles and sulci are normal for age. There are multiple small foci of T2 hyperintensity in the subcortical and deep cerebral white matter bilaterally, mildly advanced for age. The brainstem and cerebellum are unremarkable.  Orbits are unremarkable. Paranasal sinuses and mastoid air cells are clear. Major intracranial vascular flow voids are preserved.  MRA HEAD FINDINGS  The visualized distal vertebral arteries are patent with the left being mildly dominant. There is mild irregularity of the right V4 segment without significant stenosis. PICA origins are patent. Right AICA and bilateral SCA origins are patent. Basilar artery is patent without stenosis. Posterior communicating arteries are not clearly identified. PCAs are patent without significant stenosis.  Internal carotid arteries are patent from skullbase to carotid termini without stenosis. ACAs and MCAs are unremarkable. There is a small, patent anterior communicating artery. No intracranial aneurysm is identified.  IMPRESSION: 1. No acute intracranial abnormality. 2. Mildly advanced cerebral white matter disease for age, nonspecific. This may be due to chronic small vessel ischemia, with other considerations including the sequelae of trauma, hypercoagulable state, vasculitis, migraines, prior infection or demyelination. 3. No major intracranial arterial occlusion or significant stenosis.   Electronically Signed   By: Logan Bores   On: 04/10/2014 08:35   Mri Brain Without Contrast  04/10/2014   CLINICAL DATA:  Right-sided facial numbness and upper extremity paresthesia lasting 10 min. TIA.  EXAM: MRI HEAD WITHOUT CONTRAST  MRA HEAD WITHOUT CONTRAST  TECHNIQUE: Multiplanar, multiecho pulse sequences of the brain and surrounding structures were obtained without intravenous contrast. Angiographic images of the head were obtained using MRA technique without contrast.  COMPARISON:  Head CT  04/08/2014  FINDINGS: MRI HEAD FINDINGS  There is no evidence of acute infarct, intracranial hemorrhage, mass, midline shift, or extra-axial fluid collection. Ventricles and sulci are normal for age. There are multiple small foci of T2 hyperintensity in the subcortical and deep cerebral white matter bilaterally, mildly advanced for age. The brainstem and cerebellum are unremarkable.  Orbits are unremarkable. Paranasal sinuses and mastoid air cells are clear. Major intracranial vascular flow voids are preserved.  MRA HEAD FINDINGS  The visualized distal vertebral arteries are patent with the left being mildly dominant. There is mild irregularity of the right V4 segment without significant stenosis. PICA origins are patent. Right AICA and bilateral SCA origins are patent. Basilar artery is patent without stenosis. Posterior communicating arteries are not clearly identified. PCAs are patent without significant stenosis.  Internal carotid arteries are patent from skullbase to carotid termini without stenosis. ACAs and MCAs are unremarkable. There is a small, patent anterior communicating artery. No intracranial aneurysm is identified.  IMPRESSION: 1. No acute intracranial abnormality. 2. Mildly advanced cerebral white matter disease for age, nonspecific. This may be due to chronic small vessel ischemia, with other considerations including the sequelae of trauma, hypercoagulable state, vasculitis, migraines, prior infection or demyelination. 3. No major intracranial arterial occlusion or significant stenosis.   Electronically Signed   By: Logan Bores  On: 04/10/2014 08:35   US Carotid Bilateral  04/09/2014   CLINICAL DATA:  TIA.  Right-sided numbness and weakness last night.  EXAM: BILATERAL CAROTID DUPLEX ULTRASOUND  TECHNIQUE: Pearline Cables scale imaging, color Doppler and duplex ultrasound were performed of bilateral carotid and vertebral arteries in the neck.  COMPARISON:  Brain CT, 04/08/2014  FINDINGS: Criteria:  Quantification of carotid stenosis is based on velocity parameters that correlate the residual internal carotid diameter with NASCET-based stenosis levels, using the diameter of the distal internal carotid lumen as the denominator for stenosis measurement.  The following velocity measurements were obtained:  RIGHT  ICA:  8 3 cm/sec  CCA:  83 cm/sec  SYSTOLIC ICA/CCA RATIO:  1.0  DIASTOLIC ICA/CCA RATIO:  1.4  ECA:  94 cm/sec  LEFT  ICA:  85 cm/sec  CCA:  74 cm/sec  SYSTOLIC ICA/CCA RATIO:  1.2  DIASTOLIC ICA/CCA RATIO:  1.0  ECA:  69 cm/sec  RIGHT CAROTID ARTERY: Mild noncalcified plaque in the carotid bulb and proximal right internal carotid artery. No hemodynamically significant stenosis. There is also mild plaque, noncalcified common mid to distal common carotid artery.  RIGHT VERTEBRAL ARTERY:  Antegrade flow with normal waveform.  LEFT CAROTID ARTERY: Mild noncalcified plaque in the carotid bulb. No significant stenosis.  LEFT VERTEBRAL ARTERY:  Antegrade flow with normal waveform.  IMPRESSION: 1. No evidence of a hemodynamically significant stenosis. 2. Mild noncalcified plaque in the right common carotid artery, carotid bulb right proximal internal carotid artery and in the left carotid bulb.   Electronically Signed   By: Lajean Manes M.D.   On: 04/09/2014 14:31    Microbiology: No results found for this or any previous visit (from the past 240 hour(s)).   Labs: Basic Metabolic Panel:  Recent Labs Lab 04/08/14 2115 04/08/14 2133  NA 142 142  K 3.6 3.6  CL 107 104  CO2 28  --   GLUCOSE 93 92  BUN 13 12  CREATININE 0.82 0.90  CALCIUM 9.2  --    Liver Function Tests:  Recent Labs Lab 04/08/14 2115  AST 17  ALT 17  ALKPHOS 77  BILITOT 0.4  PROT 7.0  ALBUMIN 4.2   No results for input(s): LIPASE, AMYLASE in the last 168 hours. No results for input(s): AMMONIA in the last 168 hours. CBC:  Recent Labs Lab 04/08/14 2115 04/08/14 2133  WBC 11.8*  --   NEUTROABS 7.4  --    HGB 14.5 15.0  HCT 42.2 44.0  MCV 94.6  --   PLT 273  --    Cardiac Enzymes: No results for input(s): CKTOTAL, CKMB, CKMBINDEX, TROPONINI in the last 168 hours. BNP: BNP (last 3 results) No results for input(s): BNP in the last 8760 hours.  ProBNP (last 3 results) No results for input(s): PROBNP in the last 8760 hours.  CBG:  Recent Labs Lab 04/09/14 0725 04/09/14 1717 04/09/14 2037 04/10/14 0724  GLUCAP 100* 100* 112* 109*    Signed:  Johnte Portnoy K  [facility] 04/10/2014, 11:17 AM

## 2014-04-10 NOTE — Progress Notes (Signed)
1113 Patient given d/c paperwork, hard rx and d/c instructions. Patient aware of scheduled f/u apt with PCP Dr.Luking on 04/17/14 @10am . The importance of smoking cessation and controlling high blood pressure discussed with patient regarding preventing stroke. IV catheter removed from RIGHT AC, catheter tip intact, no s/s of infection noted. Patient tolerated well. Tele removed from patient and central tele notified and made aware that patient was d/c home. Family at bedside to transport patient home.

## 2014-04-17 ENCOUNTER — Encounter: Payer: Self-pay | Admitting: Family Medicine

## 2014-04-17 ENCOUNTER — Ambulatory Visit (INDEPENDENT_AMBULATORY_CARE_PROVIDER_SITE_OTHER): Payer: 59 | Admitting: Family Medicine

## 2014-04-17 VITALS — BP 112/70 | Ht 60.0 in | Wt 150.0 lb

## 2014-04-17 DIAGNOSIS — I10 Essential (primary) hypertension: Secondary | ICD-10-CM

## 2014-04-17 NOTE — Progress Notes (Signed)
   Subjective:    Patient ID: Vanessa Rojas, female    DOB: 09-22-1965, 49 y.o.   MRN: [id number]  Hypertension This is a chronic problem. The current episode started more than 1 year ago. Pertinent negatives include no chest pain. Treatments tried: metoprolol, hctz. There are no compliance problems (pt states she eats healthy and tries to walk when she can).   Went to ED because right side of face, arm and leg were numb.  ED started pt on HCTZ.   No other concerns today.   Hospital records were reviewed in detail.  Review of Systems  Constitutional: Negative for activity change, appetite change and fatigue.  HENT: Negative for congestion.   Respiratory: Negative for cough.   Cardiovascular: Negative for chest pain.  Gastrointestinal: Negative for abdominal pain.  Endocrine: Negative for polydipsia and polyphagia.  Neurological: Negative for weakness.  Psychiatric/Behavioral: Negative for confusion.       Objective:   Physical Exam  Constitutional: She appears well-nourished. No distress.  Cardiovascular: Normal rate, regular rhythm and normal heart sounds.   No murmur heard. Pulmonary/Chest: Effort normal and breath sounds normal. No respiratory distress.  Musculoskeletal: She exhibits no edema.  Lymphadenopathy:    She has no cervical adenopathy.  Neurological: She is alert. She exhibits normal muscle tone.  Psychiatric: Her behavior is normal.  Vitals reviewed.         Assessment & Plan:  HTN good control-changed HCTZ 25 mg every morning also potassium 20 mEq daily check metabolic 7 in the fall along with A1c and lipid Slight hyperlipidemia recheck it again in 6 months Prediabetes watch diet stay physically active try to lose weight recheck it again in 6 months Encourage patient to quit smoking.

## 2014-04-17 NOTE — Patient Instructions (Addendum)
DASH Eating Plan DASH stands for "Dietary Approaches to Stop Hypertension." The DASH eating plan is a healthy eating plan that has been shown to reduce high blood pressure (hypertension). Additional health benefits may include reducing the risk of type 2 diabetes mellitus, heart disease, and stroke. The DASH eating plan may also help with weight loss. WHAT DO I NEED TO KNOW ABOUT THE DASH EATING PLAN? For the DASH eating plan, you will follow these general guidelines:  Choose foods with a percent daily value for sodium of less than 5% (as listed on the food label).  Use salt-free seasonings or herbs instead of table salt or sea salt.  Check with your health care provider or pharmacist before using salt substitutes.  Eat lower-sodium products, often labeled as "lower sodium" or "no salt added."  Eat fresh foods.  Eat more vegetables, fruits, and low-fat dairy products.  Choose whole grains. Look for the word "whole" as the first word in the ingredient list.  Choose fish and skinless chicken or turkey more often than red meat. Limit fish, poultry, and meat to 6 oz (170 g) each day.  Limit sweets, desserts, sugars, and sugary drinks.  Choose heart-healthy fats.  Limit cheese to 1 oz (28 g) per day.  Eat more home-cooked food and less restaurant, buffet, and fast food.  Limit fried foods.  Cook foods using methods other than frying.  Limit canned vegetables. If you do use them, rinse them well to decrease the sodium.  When eating at a restaurant, ask that your food be prepared with less salt, or no salt if possible. WHAT FOODS CAN I EAT? Seek help from a dietitian for individual calorie needs. Grains Whole grain or whole wheat bread. Brown rice. Whole grain or whole wheat pasta. Quinoa, bulgur, and whole grain cereals. Low-sodium cereals. Corn or whole wheat flour tortillas. Whole grain cornbread. Whole grain crackers. Low-sodium crackers. Vegetables Fresh or frozen vegetables  (raw, steamed, roasted, or grilled). Low-sodium or reduced-sodium tomato and vegetable juices. Low-sodium or reduced-sodium tomato sauce and paste. Low-sodium or reduced-sodium canned vegetables.  Fruits All fresh, canned (in natural juice), or frozen fruits. Meat and Other Protein Products Ground beef (85% or leaner), grass-fed beef, or beef trimmed of fat. Skinless chicken or turkey. Ground chicken or turkey. Pork trimmed of fat. All fish and seafood. Eggs. Dried beans, peas, or lentils. Unsalted nuts and seeds. Unsalted canned beans. Dairy Low-fat dairy products, such as skim or 1% milk, 2% or reduced-fat cheeses, low-fat ricotta or cottage cheese, or plain low-fat yogurt. Low-sodium or reduced-sodium cheeses. Fats and Oils Tub margarines without trans fats. Light or reduced-fat mayonnaise and salad dressings (reduced sodium). Avocado. Safflower, olive, or canola oils. Natural peanut or almond butter. Other Unsalted popcorn and pretzels. The items listed above may not be a complete list of recommended foods or beverages. Contact your dietitian for more options. WHAT FOODS ARE NOT RECOMMENDED? Grains White bread. White pasta. White rice. Refined cornbread. Bagels and croissants. Crackers that contain trans fat. Vegetables Creamed or fried vegetables. Vegetables in a cheese sauce. Regular canned vegetables. Regular canned tomato sauce and paste. Regular tomato and vegetable juices. Fruits Dried fruits. Canned fruit in light or heavy syrup. Fruit juice. Meat and Other Protein Products Fatty cuts of meat. Ribs, chicken wings, bacon, sausage, bologna, salami, chitterlings, fatback, hot dogs, bratwurst, and packaged luncheon meats. Salted nuts and seeds. Canned beans with salt. Dairy Whole or 2% milk, cream, half-and-half, and cream cheese. Whole-fat or sweetened yogurt. Full-fat   cheeses or blue cheese. Nondairy creamers and whipped toppings. Processed cheese, cheese spreads, or cheese  curds. Condiments Onion and garlic salt, seasoned salt, table salt, and sea salt. Canned and packaged gravies. Worcestershire sauce. Tartar sauce. Barbecue sauce. Teriyaki sauce. Soy sauce, including reduced sodium. Steak sauce. Fish sauce. Oyster sauce. Cocktail sauce. Horseradish. Ketchup and mustard. Meat flavorings and tenderizers. Bouillon cubes. Hot sauce. Tabasco sauce. Marinades. Taco seasonings. Relishes. Fats and Oils Butter, stick margarine, lard, shortening, ghee, and bacon fat. Coconut, palm kernel, or palm oils. Regular salad dressings. Other Pickles and olives. Salted popcorn and pretzels. The items listed above may not be a complete list of foods and beverages to avoid. Contact your dietitian for more information. WHERE CAN I FIND MORE INFORMATION? National Heart, Lung, and Blood Institute: travelstabloid.com Document Released: 12/26/2010 Document Revised: 05/23/2013 Document Reviewed: 11/10/2012 [facility] Patient Information 2015 [place], [place]. This information is not intended to replace advice given to you by your health care provider. Make sure you discuss any questions you have with your health care provider.   Please do labs (call us) 1 to 2 weeks before follow up. Office visit in July or AugustSmoking Cessation Quitting smoking is important to your health and has many advantages. However, it is not always easy to quit since nicotine is a very addictive drug. Oftentimes, people try 3 times or more before being able to quit. This document explains the best ways for you to prepare to quit smoking. Quitting takes hard work and a lot of effort, but you can do it. ADVANTAGES OF QUITTING SMOKING  You will live longer, feel better, and live better.  Your body will feel the impact of quitting smoking almost immediately.  Within 20 minutes, blood pressure decreases. Your pulse returns to its normal level.  After 8 hours, carbon monoxide levels in  the blood return to normal. Your oxygen level increases.  After 24 hours, the chance of having a heart attack starts to decrease. Your breath, hair, and body stop smelling like smoke.  After 48 hours, damaged nerve endings begin to recover. Your sense of taste and smell improve.  After 72 hours, the body is virtually free of nicotine. Your bronchial tubes relax and breathing becomes easier.  After 2 to 12 weeks, lungs can hold more air. Exercise becomes easier and circulation improves.  The risk of having a heart attack, stroke, cancer, or lung disease is greatly reduced.  After 1 year, the risk of coronary heart disease is cut in half.  After 5 years, the risk of stroke falls to the same as a nonsmoker.  After 10 years, the risk of lung cancer is cut in half and the risk of other cancers decreases significantly.  After 15 years, the risk of coronary heart disease drops, usually to the level of a nonsmoker.  If you are pregnant, quitting smoking will improve your chances of having a healthy baby.  The people you live with, especially any children, will be healthier.  You will have extra money to spend on things other than cigarettes. QUESTIONS TO THINK ABOUT BEFORE ATTEMPTING TO QUIT You may want to talk about your answers with your health care provider.  Why do you want to quit?  If you tried to quit in the past, what helped and what did not?  What will be the most difficult situations for you after you quit? How will you plan to handle them?  Who can help you through the tough times? Your family? Friends?  A health care provider?  What pleasures do you get from smoking? What ways can you still get pleasure if you quit? Here are some questions to ask your health care provider:  How can you help me to be successful at quitting?  What medicine do you think would be best for me and how should I take it?  What should I do if I need more help?  What is smoking withdrawal like?  How can I get information on withdrawal? GET READY  Set a quit date.  Change your environment by getting rid of all cigarettes, ashtrays, matches, and lighters in your home, car, or work. Do not let people smoke in your home.  Review your past attempts to quit. Think about what worked and what did not. GET SUPPORT AND ENCOURAGEMENT You have a better chance of being successful if you have help. You can get support in many ways.  Tell your family, friends, and coworkers that you are going to quit and need their support. Ask them not to smoke around you.  Get individual, group, or telephone counseling and support. Programs are available at [employer] and health centers. Call your local health department for information about programs in your area.  Spiritual beliefs and practices may help some smokers quit.  Download a "quit meter" on your computer to keep track of quit statistics, such as how long you have gone without smoking, cigarettes not smoked, and money saved.  Get a self-help book about quitting smoking and staying off tobacco. [place]aly yourself from urges to smoke. Talk to someone, go for a walk, or occupy your time with a task.  Change your normal routine. Take a different route to work. Drink tea instead of coffee. Eat breakfast in a different place.  Reduce your stress. Take a hot bath, exercise, or read a book.  Plan something enjoyable to do every day. Reward yourself for not smoking.  Explore interactive web-based programs that specialize in helping you quit. GET MEDICINE AND USE IT CORRECTLY Medicines can help you stop smoking and decrease the urge to smoke. Combining medicine with the above behavioral methods and support can greatly increase your chances of successfully quitting smoking.  Nicotine replacement therapy helps deliver nicotine to your body without the negative effects and risks of smoking. Nicotine replacement therapy  includes nicotine gum, lozenges, inhalers, nasal sprays, and skin patches. Some may be available over-the-counter and others require a prescription.  Antidepressant medicine helps people abstain from smoking, but how this works is unknown. This medicine is available by prescription.  Nicotinic receptor partial agonist medicine simulates the effect of nicotine in your brain. This medicine is available by prescription. Ask your health care provider for advice about which medicines to use and how to use them based on your health history. Your health care provider will tell you what side effects to look out for if you choose to be on a medicine or therapy. Carefully read the information on the package. Do not use any other product containing nicotine while using a nicotine replacement product.  RELAPSE OR DIFFICULT SITUATIONS Most relapses occur within the first 3 months after quitting. Do not be discouraged if you start smoking again. Remember, most people try several times before finally quitting. You may have symptoms of withdrawal because your body is used to nicotine. You may crave cigarettes, be irritable, feel very hungry, cough often, get headaches, or have difficulty concentrating. The withdrawal symptoms are only  temporary. They are strongest when you first quit, but they will go away within 10-14 days. To reduce the chances of relapse, try to:  Avoid drinking alcohol. Drinking lowers your chances of successfully quitting.  Reduce the amount of caffeine you consume. Once you quit smoking, the amount of caffeine in your body increases and can give you symptoms, such as a rapid heartbeat, sweating, and anxiety.  Avoid smokers because they can make you want to smoke.  Do not let weight gain distract you. Many smokers will gain weight when they quit, usually less than 10 pounds. Eat a healthy diet and stay active. You can always lose the weight gained after you quit.  Find ways to improve your  mood other than smoking. FOR MORE INFORMATION  www.smokefree.gov  Document Released: 12/31/2000 Document Revised: 05/23/2013 Document Reviewed: 04/17/2011 [facility] Patient Information 2015 [place], [place]. This information is not intended to replace advice given to you by your health care provider. Make sure you discuss any questions you have with your health care provider.

## 2014-04-20 LAB — HEMOGLOBIN A1C
Hgb A1c MFr Bld: 5.7 % — ABNORMAL HIGH (ref 4.8–5.6)
Mean Plasma Glucose: 117 mg/dL

## 2014-08-14 ENCOUNTER — Ambulatory Visit (HOSPITAL_COMMUNITY)
Admission: RE | Admit: 2014-08-14 | Discharge: 2014-08-14 | Disposition: A | Discharge status: Home / Self Care | Payer: 59 | Source: Ambulatory Visit | Attending: Family Medicine | Admitting: Family Medicine

## 2014-08-14 ENCOUNTER — Encounter: Payer: Self-pay | Admitting: Family Medicine

## 2014-08-14 ENCOUNTER — Ambulatory Visit (INDEPENDENT_AMBULATORY_CARE_PROVIDER_SITE_OTHER): Payer: 59 | Admitting: Family Medicine

## 2014-08-14 VITALS — BP 114/76 | Temp 98.3°F | Ht 60.0 in | Wt 151.5 lb

## 2014-08-14 DIAGNOSIS — R05 Cough: Secondary | ICD-10-CM | POA: Insufficient documentation

## 2014-08-14 DIAGNOSIS — I10 Essential (primary) hypertension: Secondary | ICD-10-CM | POA: Insufficient documentation

## 2014-08-14 DIAGNOSIS — R079 Chest pain, unspecified: Secondary | ICD-10-CM | POA: Insufficient documentation

## 2014-08-14 DIAGNOSIS — J208 Acute bronchitis due to other specified organisms: Secondary | ICD-10-CM | POA: Diagnosis not present

## 2014-08-14 DIAGNOSIS — R059 Cough, unspecified: Secondary | ICD-10-CM

## 2014-08-14 MED ORDER — BUPROPION HCL ER (SR) 150 MG PO TB12: 150.0000 mg | ORAL_TABLET | Freq: Two times a day (BID) | ORAL | Status: DC

## 2014-08-14 MED ORDER — CLARITHROMYCIN 500 MG PO TABS: 500.0000 mg | ORAL_TABLET | Freq: Two times a day (BID) | ORAL | Status: DC

## 2014-08-14 MED ORDER — ALBUTEROL SULFATE HFA 108 (90 BASE) MCG/ACT IN AERS: 2.0000 | INHALATION_SPRAY | Freq: Four times a day (QID) | RESPIRATORY_TRACT | Status: DC | PRN

## 2014-08-14 NOTE — Progress Notes (Signed)
   Subjective:    Patient ID: Vanessa Rojas, female    DOB: [birth year], 49 y.o.   MRN: [id number]  Cough This is a new problem. The current episode started more than 1 month ago. The problem has been unchanged. The problem occurs constantly. The cough is non-productive. Associated symptoms include wheezing. Associated symptoms comments: Left side abdominal pain . Nothing aggravates the symptoms. Treatments tried: benzonatate, prednisone. The treatment provided mild relief.   Patient has no other concerns at this time.   Patient does smoke she notes she needs to quit she is trying hard   Review of Systems  Respiratory: Positive for cough and wheezing.    No vomiting or diarrhea. No high fever chills. No hemoptysis.    Objective:   Physical Exam Frequent coughing in the office not respiratory distress heart regular pulse normal skin warm dry neurologic gross normal       Assessment & Plan:  Persistent cough/bronchitis/secondary infection-antibiotics prescribed if not significantly better over the next 10 days I would recommend pulmonary function testing. The patient is to give [place] feedback within 10 days how she is doing  X-ray ordered. Await the results  Patient encouraged quit smoking use Wellbutrin on a regular basis.

## 2014-10-06 ENCOUNTER — Other Ambulatory Visit: Payer: Self-pay | Admitting: Family Medicine

## 2014-11-13 ENCOUNTER — Encounter: Payer: Self-pay | Admitting: Family Medicine

## 2014-11-13 ENCOUNTER — Ambulatory Visit (INDEPENDENT_AMBULATORY_CARE_PROVIDER_SITE_OTHER): Payer: 59 | Admitting: Family Medicine

## 2014-11-13 VITALS — BP 120/80 | Temp 98.4°F | Ht 60.0 in | Wt 148.4 lb

## 2014-11-13 DIAGNOSIS — J208 Acute bronchitis due to other specified organisms: Secondary | ICD-10-CM

## 2014-11-13 DIAGNOSIS — R053 Chronic cough: Secondary | ICD-10-CM

## 2014-11-13 DIAGNOSIS — R05 Cough: Secondary | ICD-10-CM | POA: Diagnosis not present

## 2014-11-13 DIAGNOSIS — R059 Cough, unspecified: Secondary | ICD-10-CM

## 2014-11-13 DIAGNOSIS — J329 Chronic sinusitis, unspecified: Secondary | ICD-10-CM

## 2014-11-13 MED ORDER — CEFDINIR 300 MG PO CAPS: 300.0000 mg | ORAL_CAPSULE | Freq: Two times a day (BID) | ORAL | Status: DC

## 2014-11-13 MED ORDER — AEROCHAMBER MINI CHAMBER DEVI: Status: DC

## 2014-11-13 MED ORDER — PREDNISONE 20 MG PO TABS: ORAL_TABLET | ORAL | Status: DC

## 2014-11-13 NOTE — Progress Notes (Signed)
   Subjective:    Patient ID: Vanessa Rojas, female    DOB: July 29, 1965, 49 y.o.   MRN: [id number] Patient presents with chronic concerns that she is very concerned about. Next  Please see July note from Dr. Nicki Reaper. Patient has not returned since then. States she has been to urgent care once since then.   Cough This is a new problem. The current episode started more than 1 month ago. The problem has been unchanged. The cough is non-productive. Associated symptoms comments: Sweet taste in mouth . Nothing aggravates the symptoms. She has tried nothing for the symptoms. The treatment provided no relief.   Patient states that she has no other concerns at this time.   Cough bad at night with wheeezing  Two weeks has had a cong of fruity sweet taste,, intermittently drainage and discharge accompanied by frontal headache comes and goes worse with positional change  Smokes a pack per day unfortunately has not been able to stop. Next  Short of breath and wheezy with any kind of exertion. No chest pain no nausea no diaphoresis.  Asthma none known  Lung cancer, in both parents and pemphysemea in father  Unfortunately patient continues to smoke despite family history both parents having died from lung cancer., Also emphysema history in father.  Congestion drainage frontal headache diminished energy  Review of Systems  Respiratory: Positive for cough.    no rash no weight loss negative x-ray earlier this summer    Objective:   Physical Exam  alert mild malaise. Vital stable HET Mondays congestion TMs retracted trace normal neck supple lungs currently rare wheeze with deep breath heart regular in rhythm.       Assessment & Plan:   impression 1 subacute bronchitis #2 chronic cough with worsening could represent COPD with asthmatic component discussed at great length #3 probable rhinosinusitis element #4 chronic smoker plan encouraged to stop smoking immediately. Prednisone taper. Antibiotics  prescribed. In 2 weeks pre-and post pulmonary function tests see Dr. Nicki Reaper in 3 weeks easily 25 minutes spent most in discussion WSL

## 2014-11-17 ENCOUNTER — Ambulatory Visit (HOSPITAL_COMMUNITY)
Admission: RE | Admit: 2014-11-17 | Discharge: 2014-11-17 | Disposition: A | Discharge status: Home / Self Care | Payer: 59 | Source: Ambulatory Visit | Attending: Family Medicine | Admitting: Family Medicine

## 2014-11-17 DIAGNOSIS — R05 Cough: Secondary | ICD-10-CM | POA: Insufficient documentation

## 2014-11-17 LAB — PULMONARY FUNCTION TEST
FEF 25-75 POST: 2.24 L/s
FEF 25-75 Pre: 1.72 L/sec
FEF2575-%Change-Post: 30 %
FEF2575-%PRED-POST: 89 %
FEF2575-%Pred-Pre: 68 %
FEV1-%CHANGE-POST: 6 %
FEV1-%Pred-Post: 92 %
FEV1-%Pred-Pre: 86 %
FEV1-POST: 2.23 L
FEV1-Pre: 2.09 L
FEV1FVC-%CHANGE-POST: 6 %
FEV1FVC-%Pred-Pre: 96 %
FEV6-%Change-Post: 0 %
FEV6-%PRED-PRE: 91 %
FEV6-%Pred-Post: 91 %
FEV6-POST: 2.72 L
FEV6-Pre: 2.72 L
FEV6FVC-%PRED-POST: 103 %
FEV6FVC-%Pred-Pre: 103 %
FVC-%CHANGE-POST: 0 %
FVC-%PRED-POST: 89 %
FVC-%PRED-PRE: 89 %
FVC-POST: 2.72 L
FVC-PRE: 2.72 L
PRE FEV1/FVC RATIO: 77 %
PRE FEV6/FVC RATIO: 100 %
Post FEV1/FVC ratio: 82 %
Post FEV6/FVC ratio: 100 %

## 2014-11-17 MED ORDER — ALBUTEROL SULFATE (2.5 MG/3ML) 0.083% IN NEBU
2.5000 mg | INHALATION_SOLUTION | Freq: Once | RESPIRATORY_TRACT | Status: AC
  Administered 2014-11-17: 2.5 mg via RESPIRATORY_TRACT

## 2014-12-05 ENCOUNTER — Encounter: Payer: Self-pay | Admitting: Family Medicine

## 2014-12-05 ENCOUNTER — Ambulatory Visit (INDEPENDENT_AMBULATORY_CARE_PROVIDER_SITE_OTHER): Payer: 59 | Admitting: Family Medicine

## 2014-12-05 VITALS — BP 132/84 | Ht 60.0 in | Wt 151.0 lb

## 2014-12-05 DIAGNOSIS — K219 Gastro-esophageal reflux disease without esophagitis: Secondary | ICD-10-CM | POA: Insufficient documentation

## 2014-12-05 DIAGNOSIS — J4520 Mild intermittent asthma, uncomplicated: Secondary | ICD-10-CM | POA: Diagnosis not present

## 2014-12-05 MED ORDER — PANTOPRAZOLE SODIUM 40 MG PO TBEC: 40.0000 mg | DELAYED_RELEASE_TABLET | Freq: Every day | ORAL | Status: DC

## 2014-12-05 NOTE — Progress Notes (Signed)
   Subjective:    Patient ID: Vanessa Rojas, female    DOB: 12/09/1965, 49 y.o.   MRN: [id number]  HPIpt arrives today to follow up on pft. Pt states she has a cough from June til oct but it is gone now. Finished omnicef and prednisone.   Trouble with indigestion. Taking tums. Worse this month. Having a lot of belching and reflux. Worse at night when lying down.  tums does help. Takes 2 -3 a day.    Declines flu vaccine.    Review of Systems    patient with occasional cough denies any wheezing difficulty breathing denies fever chills sweats denies rectal bleeding but does relate some intermittent indigestion issues Objective:   Physical Exam  Lungs clear hearts regular pulse normal BP good abdomen is soft no guarding rebound or tenderness Patient has been counseled regarding the need of colonoscopy. She will consider it    patient will need lab work and office visit in the spring time Assessment & Plan:  GERD-lifestyle changes were recommended in addition to this PPI for 2 months if doing well then stop medication  Recent pulmonary function does show some reactive airway but this patient states that she is actually doing better with the breathing does not want to be on any type of daily inhaler she will just use albuterol when necessary she states she is not having to use it frequently  She has quit smoking she is try to watch her diet to avoid weight gain

## 2014-12-06 ENCOUNTER — Other Ambulatory Visit: Payer: Self-pay | Admitting: Family Medicine

## 2015-03-06 ENCOUNTER — Other Ambulatory Visit: Payer: Self-pay | Admitting: Family Medicine

## 2015-05-16 ENCOUNTER — Encounter: Payer: Self-pay | Admitting: Nurse Practitioner

## 2015-05-16 ENCOUNTER — Ambulatory Visit (INDEPENDENT_AMBULATORY_CARE_PROVIDER_SITE_OTHER): Payer: BLUE CROSS/BLUE SHIELD | Admitting: Nurse Practitioner

## 2015-05-16 VITALS — BP 140/86 | Temp 98.2°F | Ht 60.0 in | Wt 151.0 lb

## 2015-05-16 DIAGNOSIS — R062 Wheezing: Secondary | ICD-10-CM

## 2015-05-16 DIAGNOSIS — J45991 Cough variant asthma: Secondary | ICD-10-CM | POA: Diagnosis not present

## 2015-05-16 DIAGNOSIS — K219 Gastro-esophageal reflux disease without esophagitis: Secondary | ICD-10-CM

## 2015-05-16 MED ORDER — ALBUTEROL SULFATE (2.5 MG/3ML) 0.083% IN NEBU: INHALATION_SOLUTION | RESPIRATORY_TRACT | Status: DC

## 2015-05-16 MED ORDER — PANTOPRAZOLE SODIUM 40 MG PO TBEC: 40.0000 mg | DELAYED_RELEASE_TABLET | Freq: Every day | ORAL | Status: DC

## 2015-05-16 MED ORDER — PREDNISONE 20 MG PO TABS
ORAL_TABLET | ORAL | Status: DC
Start: 2015-05-16 — End: 2015-06-29

## 2015-05-16 MED ORDER — FLUTICASONE FUROATE-VILANTEROL 100-25 MCG/INH IN AEPB: 1.0000 | INHALATION_SPRAY | Freq: Every day | RESPIRATORY_TRACT | Status: DC

## 2015-05-16 MED ORDER — CEFDINIR 300 MG PO CAPS: 300.0000 mg | ORAL_CAPSULE | Freq: Two times a day (BID) | ORAL | Status: DC

## 2015-05-16 MED ORDER — ALBUTEROL SULFATE (2.5 MG/3ML) 0.083% IN NEBU
2.5000 mg | INHALATION_SOLUTION | Freq: Once | RESPIRATORY_TRACT | Status: AC
Start: 2015-05-16 — End: 2015-05-16
  Administered 2015-05-16: 2.5 mg via RESPIRATORY_TRACT

## 2015-05-17 ENCOUNTER — Other Ambulatory Visit: Payer: Self-pay

## 2015-05-17 MED ORDER — ALBUTEROL SULFATE (2.5 MG/3ML) 0.083% IN NEBU: INHALATION_SOLUTION | RESPIRATORY_TRACT | Status: DC

## 2015-05-18 ENCOUNTER — Encounter: Payer: Self-pay | Admitting: Nurse Practitioner

## 2015-05-18 DIAGNOSIS — J45991 Cough variant asthma: Secondary | ICD-10-CM | POA: Insufficient documentation

## 2015-05-18 NOTE — Progress Notes (Signed)
Subjective:  Presents for complaints of cough that began on 4/17. Worse over the past several days. Minimal fever. No sore throat. No headache or ear pain. Runny nose and head congestion. Frequent nonproductive cough worse at night, unable to sleep. Nausea but no vomiting. Wheezing at times, has an albuterol inhaler which she has not used due to nausea. Also having some acid reflux symptoms unrelieved with antacids. No abdominal pain.  Objective:   BP 140/86 mmHg  Temp(Src) 98.2 F (36.8 C) (Oral)  Ht 5' (1.524 m)  Wt 151 lb (68.493 kg)  BMI 29.49 kg/m2  LMP 05/09/2012 NAD. Alert, oriented. TMs clear effusion, no erythema. Pharynx injected with PND noted. Neck supple with mild soft anterior adenopathy. Lungs minimal expiratory occasional wheeze. Very frequent spasmodic cough especially with deep breath. No tachypnea. Normal color. Heart regular rate rhythm. Abdomen soft nondistended with mild epigastric area tenderness. A review of her pre-and post pulmonary function test indicates a probable diagnosis of cough variant asthma.  Assessment:  Problem List Items Addressed This Visit      Respiratory   Cough variant asthma - Primary   Relevant Medications   albuterol (PROVENTIL) (2.5 MG/3ML) 0.083% nebulizer solution 2.5 mg (Completed)   fluticasone furoate-vilanterol (BREO ELLIPTA) 100-25 MCG/INH AEPB   predniSONE (DELTASONE) 20 MG tablet     Digestive   Gastroesophageal reflux disease without esophagitis   Relevant Medications   pantoprazole (PROTONIX) 40 MG tablet    Other Visit Diagnoses    Wheezing        Relevant Medications    albuterol (PROVENTIL) (2.5 MG/3ML) 0.083% nebulizer solution 2.5 mg (Completed)      Plan:  Meds ordered this encounter  Medications  . albuterol (PROVENTIL) (2.5 MG/3ML) 0.083% nebulizer solution 2.5 mg    Sig:   . pantoprazole (PROTONIX) 40 MG tablet    Sig: Take 1 tablet (40 mg total) by mouth daily.    Dispense:  30 tablet    Refill:  2    Order  Specific Question:  Supervising Provider    Answer:  Mikey Kirschner [[id number]]  . DISCONTD: albuterol (PROVENTIL) (2.5 MG/3ML) 0.083% nebulizer solution    Sig: Use via neb q 4 hrs prn wheezing    Dispense:  25 vial    Refill:  2    Order Specific Question:  Supervising Provider    Answer:  Mikey Kirschner [[id number]]  . fluticasone furoate-vilanterol (BREO ELLIPTA) 100-25 MCG/INH AEPB    Sig: Inhale 1 puff into the lungs daily.    Dispense:  28 each    Refill:  5    Order Specific Question:  Supervising Provider    Answer:  Mikey Kirschner [[id number]]  . predniSONE (DELTASONE) 20 MG tablet    Sig: 3 po qd x 3 d then 2 po qd x 3 d then 1 po qd x 3 d    Dispense:  18 tablet    Refill:  0    Order Specific Question:  Supervising Provider    Answer:  Mikey Kirschner [[id number]]  . cefdinir (OMNICEF) 300 MG capsule    Sig: Take 1 capsule (300 mg total) by mouth 2 (two) times daily.    Dispense:  20 capsule    Refill:  0    Order Specific Question:  Supervising Provider    Answer:  Mikey Kirschner [[id number]]   Start Protonix as directed. Reviewed dietary measures and lifestyle changes affecting reflux. Given prescription for at home albuterol  nebulizer treatments. Discussed diagnosis of cough variant asthma. If symptoms persist after prednisone is complete, recommend Breo inhaler to help coughing. Call back in 48 hours if no improvement, call or go to ED sooner if worse.

## 2015-06-22 ENCOUNTER — Telehealth: Payer: Self-pay | Admitting: Family Medicine

## 2015-06-22 DIAGNOSIS — H209 Unspecified iridocyclitis: Secondary | ICD-10-CM

## 2015-06-22 NOTE — Telephone Encounter (Signed)
Fax forwarded to Dr.Scott's office  on yesterday according to [facility].

## 2015-06-22 NOTE — Telephone Encounter (Signed)
Spoke with Vanessa Rojas, she says it was sent back in results folder yesterday.  Patients spouse is very concerned of her condition and would like to have this done ASAP.

## 2015-06-22 NOTE — Telephone Encounter (Signed)
[facility] 06/22/15. All test ordered except FTA-ABS , and HSZ/HZV.

## 2015-06-22 NOTE — Telephone Encounter (Signed)
First of all I just received the fax-please let the family know that in urgent situations like this typically most specialist ordered the tests rather then sending a fax. In situations where the specialist are worried most specialist call us rather then depending on a fax. So therefore it is difficult to respond in a urgent manner when a typical urgent response is generated by the specialists calling us personally. These tests are quite specific. We can order these tests but it is necessary to see the patient for an office visit next week. We cannot do these testing without the patient being seen. Otherwise this testing should be done through the specialists office. I would prefer to see her either Monday or Tuesday depending on what our schedule is and this office visit needs to be in the morning hours. The following tests are being requested by the specialists chest x-ray, sedimentation rate,ACE,lysozyme,RPR,FTA-ABS,HLA-B27,Lyme titer,HSZ/HZV titers urinalysis and PPD-the reason for these test is iritis. The patient can get her blood work drawn today or Saturday or Monday she can have the PPD applied in the office with a follow-up reading 2 days later. Detailed discussion and follow-up will be through her ophthalmologist because this condition is outside of our area of expertise. We are happy to help but please understand it is necessary for [place] to see the patient this coming week

## 2015-06-22 NOTE — Telephone Encounter (Signed)
No fax was in the box at nurses station on patient.

## 2015-06-22 NOTE — Telephone Encounter (Signed)
Did we receive order from [facility] to have BW ordered for patient? It was supposed to be faxed over yesterday.

## 2015-06-25 ENCOUNTER — Telehealth: Payer: Self-pay | Admitting: Family Medicine

## 2015-06-25 DIAGNOSIS — H209 Unspecified iridocyclitis: Secondary | ICD-10-CM

## 2015-06-25 NOTE — Telephone Encounter (Signed)
The additional testing that.gemologist wanted was ordered. Please make sure patient also gets that drawn. It will be important when she goes to have this drawn that all of her testing be ordered at the same time not just this one

## 2015-06-26 LAB — URINALYSIS
Bilirubin, UA: NEGATIVE
Glucose, UA: NEGATIVE
KETONES UA: NEGATIVE
LEUKOCYTES UA: NEGATIVE
NITRITE UA: NEGATIVE
PH UA: 5.5 (ref 5.0–7.5)
Protein, UA: NEGATIVE
Specific Gravity, UA: 1.028 (ref 1.005–1.030)
Urobilinogen, Ur: 0.2 mg/dL (ref 0.2–1.0)

## 2015-06-26 NOTE — Telephone Encounter (Signed)
Patient stated she is seeing the eye specialist at [facility] tomorrow and she is going to wait on the additional labwork not drawn yesterday until she sees him and what he wants to do. Patient states she is going to let them draw additional labs there in case they want any additional tests done at same time.

## 2015-06-26 NOTE — Telephone Encounter (Signed)
Patient stated she is seeing the eye specialist at [facility] tomorrow and she is going to wait on the additional labwork not drawn yesterday until she sees him and what he wants to do. Patient states she is going to let them draw additional labs there in case they want any additional tests done at same time.

## 2015-06-29 ENCOUNTER — Ambulatory Visit (INDEPENDENT_AMBULATORY_CARE_PROVIDER_SITE_OTHER): Payer: BLUE CROSS/BLUE SHIELD | Admitting: Family Medicine

## 2015-06-29 ENCOUNTER — Ambulatory Visit (HOSPITAL_COMMUNITY)
Admission: RE | Admit: 2015-06-29 | Discharge: 2015-06-29 | Disposition: A | Discharge status: Home / Self Care | Payer: BLUE CROSS/BLUE SHIELD | Source: Ambulatory Visit | Attending: Family Medicine | Admitting: Family Medicine

## 2015-06-29 ENCOUNTER — Encounter: Payer: Self-pay | Admitting: Family Medicine

## 2015-06-29 VITALS — BP 124/78 | Ht 60.0 in | Wt 153.1 lb

## 2015-06-29 DIAGNOSIS — H209 Unspecified iridocyclitis: Secondary | ICD-10-CM | POA: Diagnosis present

## 2015-06-29 DIAGNOSIS — H20021 Recurrent acute iridocyclitis, right eye: Secondary | ICD-10-CM

## 2015-06-29 LAB — HLA-B27 ANTIGEN: HLA B27: POSITIVE

## 2015-06-29 LAB — LYSOZYME, SERUM: LYSOZYME: 4.1 ug/mL (ref 2.5–12.9)

## 2015-06-29 LAB — B. BURGDORFI ANTIBODIES

## 2015-06-29 LAB — SEDIMENTATION RATE: SED RATE: 2 mm/h (ref 0–40)

## 2015-06-29 LAB — RPR: RPR Ser Ql: NONREACTIVE

## 2015-06-29 LAB — ANGIOTENSIN CONVERTING ENZYME: ANGIO CONVERT ENZYME: 24 U/L (ref 14–82)

## 2015-06-29 NOTE — Progress Notes (Signed)
   Subjective:    Patient ID: Vanessa Rojas, female    DOB: May 19, 1965, 50 y.o.   MRN: [id number]  HPI Patient in today to discuss labs ordered by ophthalmologist . 15 minutes spent discussing what the eye doctor and in [place] as well as [facility] found plus also the need to see rheumatology also reviewed overall of her lab work. Patient understands that we are not the specialist for iritis but we are helping her get set up with the specialists. States no other concerns this visit.   Review of Systems     Objective:   Physical Exam  Iritis right eye left eye normal lungs clear heart regular pulse normal BP good      Assessment & Plan:  Significant iritis on the right eye. Ophthalmologist once multiple test ordered. Most abundant been done she will get additional ones drawn today chest x-ray ordered. She will get TB skin test next week.  Ophthalmologist recommends that she sees rheumatology we will get a urgent referral to rheumatology. They will help guide her regarding her prednisone.  I spoke with her ophthalmologist in [place]. He will gradually start tapering her prednisone but he does recommend rheumatology consultation

## 2015-07-02 ENCOUNTER — Ambulatory Visit (INDEPENDENT_AMBULATORY_CARE_PROVIDER_SITE_OTHER): Payer: BLUE CROSS/BLUE SHIELD | Admitting: *Deleted

## 2015-07-02 DIAGNOSIS — Z111 Encounter for screening for respiratory tuberculosis: Secondary | ICD-10-CM | POA: Diagnosis not present

## 2015-07-04 LAB — VARICELLA ZOSTER ANTIBODY, IGM: Varicella IgM: <0.91 index (ref 0.00–0.90)

## 2015-07-04 LAB — VARICELLA ZOSTER ANTIBODY, IGG: Varicella zoster IgG: 581 index (ref >165)

## 2015-07-04 LAB — FLUORESCENT TREPONEMAL AB(FTA)-IGG-BLD: Fluorescent Treponemal Ab, IgG: NONREACTIVE

## 2015-07-04 LAB — TB SKIN TEST
Induration: 0 mm
TB SKIN TEST: NEGATIVE

## 2015-07-04 LAB — HSV(HERPES SIMPLEX VRS) I + II AB-IGM: HSVI/II COMB AB IGM: 1.42 ratio — AB (ref 0.00–0.90)

## 2015-07-04 LAB — HSV(HERPES SIMPLEX VRS) I + II AB-IGG: HSV 2 Glycoprotein G Ab, IgG: <0.91 index (ref 0.00–0.90)

## 2015-07-17 ENCOUNTER — Other Ambulatory Visit: Payer: Self-pay | Admitting: Nurse Practitioner

## 2015-07-17 ENCOUNTER — Other Ambulatory Visit: Payer: Self-pay | Admitting: Family Medicine

## 2015-07-18 ENCOUNTER — Ambulatory Visit (INDEPENDENT_AMBULATORY_CARE_PROVIDER_SITE_OTHER): Payer: BLUE CROSS/BLUE SHIELD | Admitting: Family Medicine

## 2015-07-18 ENCOUNTER — Encounter: Payer: Self-pay | Admitting: Family Medicine

## 2015-07-18 VITALS — BP 120/70 | Ht 60.0 in | Wt 157.1 lb

## 2015-07-18 DIAGNOSIS — H209 Unspecified iridocyclitis: Secondary | ICD-10-CM

## 2015-07-18 DIAGNOSIS — Z1589 Genetic susceptibility to other disease: Secondary | ICD-10-CM

## 2015-07-18 DIAGNOSIS — R739 Hyperglycemia, unspecified: Secondary | ICD-10-CM | POA: Diagnosis not present

## 2015-07-18 LAB — POCT GLYCOSYLATED HEMOGLOBIN (HGB A1C): HEMOGLOBIN A1C: 5.2

## 2015-07-18 MED ORDER — METOPROLOL TARTRATE 100 MG PO TABS: ORAL_TABLET | ORAL | Status: DC

## 2015-07-18 MED ORDER — HYDROCHLOROTHIAZIDE 25 MG PO TABS: 25.0000 mg | ORAL_TABLET | Freq: Every day | ORAL | Status: DC

## 2015-07-18 NOTE — Telephone Encounter (Signed)
Patient was seen today.

## 2015-07-18 NOTE — Progress Notes (Signed)
   Subjective:    Patient ID: Vanessa Rojas, female    DOB: 19-Apr-1965, 50 y.o.   MRN: [id number]  HPI Patient states that she is here today to discuss her recent lab results. Patient states that she did not have a good experience at the rheumatologist she was referred too.   Patient has no other concerns at this time.  It was recommended by [facility] ophthalmologist that the patient sees a rheumatologist the patient saw rheumatology in [place] had a negative experience  Review of Systems     Objective:   Physical Exam  Lungs are clear hearts regular pulse normal BP      Assessment & Plan:  The patient is following up with her eye specialist in [facility] Dr. Leonarda Salon. He will do further evaluation and taper her prednisone. A letter was sent along with lab results for Dr. Leonarda Salon comment on  Patient does have positive HLA-B27 does need to see rheumatology but rheumatologist in [place] really did not do a good job of explaining things or discussing her risk for further iritis or other issues. We will have the patient consult with a rheumatologist at [facility]

## 2015-07-18 NOTE — Addendum Note (Signed)
Addended by: Ofilia Neas R on: 07/18/2015 01:04 PM   Modules accepted: Orders

## 2015-07-18 NOTE — Progress Notes (Signed)
Referral to Rheumatology at [facility] ordered in [place]

## 2015-07-20 ENCOUNTER — Telehealth: Payer: Self-pay | Admitting: *Deleted

## 2015-07-20 DIAGNOSIS — H209 Unspecified iridocyclitis: Secondary | ICD-10-CM

## 2015-07-20 NOTE — Telephone Encounter (Signed)
Dr. Leonarda Salon called Dr. Nicki Reaper.

## 2015-07-22 NOTE — Telephone Encounter (Signed)
I discussed this case with the patient's ophthalmologist. He will be tapering her prednisone gradually. This patient would benefit from a consultation with rheumatologist at [facility] for referral iritis and positive HLA-B27-the patient is aware that we are putting the referral in range of motion

## 2015-07-23 NOTE — Telephone Encounter (Signed)
Referral placed in [facility] 

## 2015-07-26 ENCOUNTER — Encounter: Payer: Self-pay | Admitting: Family Medicine

## 2015-08-05 ENCOUNTER — Encounter: Payer: Self-pay | Admitting: Family Medicine

## 2015-08-21 ENCOUNTER — Ambulatory Visit (INDEPENDENT_AMBULATORY_CARE_PROVIDER_SITE_OTHER): Payer: BLUE CROSS/BLUE SHIELD | Admitting: Family Medicine

## 2015-08-21 ENCOUNTER — Encounter: Payer: Self-pay | Admitting: Family Medicine

## 2015-08-21 VITALS — BP 124/80 | Temp 98.0°F | Ht 60.0 in | Wt 155.0 lb

## 2015-08-21 DIAGNOSIS — H6501 Acute serous otitis media, right ear: Secondary | ICD-10-CM | POA: Diagnosis not present

## 2015-08-21 MED ORDER — ONDANSETRON 4 MG PO TBDP: 4.0000 mg | ORAL_TABLET | Freq: Four times a day (QID) | ORAL | 0 refills | Status: DC | PRN

## 2015-08-21 MED ORDER — CEFDINIR 300 MG PO CAPS: 300.0000 mg | ORAL_CAPSULE | Freq: Two times a day (BID) | ORAL | 0 refills | Status: DC

## 2015-08-21 MED ORDER — PANTOPRAZOLE SODIUM 40 MG PO TBEC: 40.0000 mg | DELAYED_RELEASE_TABLET | Freq: Two times a day (BID) | ORAL | 5 refills | Status: DC

## 2015-08-21 NOTE — Patient Instructions (Signed)
Middle ear infections can often take several weeks for the fluid to leave the middle ear in adults

## 2015-08-21 NOTE — Progress Notes (Signed)
   Subjective:    Patient ID: Vanessa Rojas, female    DOB: [birth year], 50 y.o.   MRN: [id number]  Sinusitis  This is a new problem. Episode onset: 4 days. Associated symptoms include a hoarse voice. (Ear stopped up, vomiting, nausea, coughing, no energy)   Patient has been on a lot of steroids the past couple months to treat iritis. Now under the care of the specialist in [facility]. Next  Reports increased nausea. Positive long-standing history of reflux. Mild postprandial component. Next  Right ear full congested no obvious pain but definite muffling   Review of Systems  HENT: Positive for hoarse voice.        Objective:   Physical Exam Alert vital stable right otitis media present Cecille Rubin normal neck supple lungs clear no tachypnea heart regular in rhythm. Abdomen no epigastric tenderness no rebound no guarding       Assessment & Plan:  Impression 1 right otitis media increased risk of this with protracted recent steroids discussed #2 gastritis equivalent suspected though no particular tenderness abdomen. Has been on high-dose steroids plan double up protonic for now. Antibiotics prescribed. Nausea medicine when necessary.

## 2016-01-28 ENCOUNTER — Other Ambulatory Visit: Payer: Self-pay | Admitting: Family Medicine

## 2016-04-08 ENCOUNTER — Other Ambulatory Visit: Payer: Self-pay | Admitting: Family Medicine

## 2016-05-03 ENCOUNTER — Other Ambulatory Visit: Payer: Self-pay | Admitting: Family Medicine

## 2016-06-07 ENCOUNTER — Other Ambulatory Visit: Payer: Self-pay | Admitting: Family Medicine

## 2016-08-30 ENCOUNTER — Other Ambulatory Visit: Payer: Self-pay | Admitting: Family Medicine

## 2016-11-04 ENCOUNTER — Other Ambulatory Visit: Payer: Self-pay | Admitting: Family Medicine

## 2016-11-04 ENCOUNTER — Telehealth: Payer: Self-pay | Admitting: Family Medicine

## 2016-11-04 NOTE — Telephone Encounter (Signed)
Pt is needing a refill on  metoprolol tartrate (LOPRESSOR) 100 MG tablet To last to her appt on 11/13. That was the soonest that she could schedule with Dr. Nicki Reaper.     [place]

## 2016-11-04 NOTE — Telephone Encounter (Signed)
May have one refill with one additional refill

## 2016-11-05 MED ORDER — METOPROLOL TARTRATE 100 MG PO TABS: ORAL_TABLET | ORAL | 1 refills | Status: DC

## 2016-11-05 MED ORDER — HYDROCHLOROTHIAZIDE 25 MG PO TABS: 25.0000 mg | ORAL_TABLET | Freq: Every day | ORAL | 1 refills | Status: DC

## 2016-11-05 NOTE — Telephone Encounter (Signed)
Spoke with patient and informed her that we are sending in a refills on her metoprolol and hctz. Patient verbalized understanding.

## 2016-12-02 ENCOUNTER — Ambulatory Visit: Payer: BLUE CROSS/BLUE SHIELD | Admitting: Family Medicine

## 2016-12-02 ENCOUNTER — Encounter: Payer: Self-pay | Admitting: Family Medicine

## 2016-12-02 VITALS — BP 110/68 | Ht 60.0 in | Wt 148.0 lb

## 2016-12-02 DIAGNOSIS — I10 Essential (primary) hypertension: Secondary | ICD-10-CM

## 2016-12-02 DIAGNOSIS — Z13220 Encounter for screening for lipoid disorders: Secondary | ICD-10-CM

## 2016-12-02 MED ORDER — HYDROCHLOROTHIAZIDE 25 MG PO TABS: 25.0000 mg | ORAL_TABLET | Freq: Every day | ORAL | 5 refills | Status: DC

## 2016-12-02 MED ORDER — METOPROLOL TARTRATE 100 MG PO TABS: ORAL_TABLET | ORAL | 5 refills | Status: DC

## 2016-12-02 MED ORDER — ALBUTEROL SULFATE (2.5 MG/3ML) 0.083% IN NEBU: INHALATION_SOLUTION | RESPIRATORY_TRACT | 2 refills | Status: DC

## 2016-12-02 NOTE — Patient Instructions (Signed)
DASH Eating Plan DASH stands for "Dietary Approaches to Stop Hypertension." The DASH eating plan is a healthy eating plan that has been shown to reduce high blood pressure (hypertension). It may also reduce your risk for type 2 diabetes, heart disease, and stroke. The DASH eating plan may also help with weight loss. What are tips for following this plan? General guidelines  Avoid eating more than 2,300 mg (milligrams) of salt (sodium) a day. If you have hypertension, you may need to reduce your sodium intake to 1,500 mg a day.  Limit alcohol intake to no more than 1 drink a day for nonpregnant women and 2 drinks a day for men. One drink equals 12 oz of beer, 5 oz of wine, or 1 oz of hard liquor.  Work with your health care provider to maintain a healthy body weight or to lose weight. Ask what an ideal weight is for you.  Get at least 30 minutes of exercise that causes your heart to beat faster (aerobic exercise) most days of the week. Activities may include walking, swimming, or biking.  Work with your health care provider or diet and nutrition specialist (dietitian) to adjust your eating plan to your individual calorie needs. Reading food labels  Check food labels for the amount of sodium per serving. Choose foods with less than 5 percent of the Daily Value of sodium. Generally, foods with less than 300 mg of sodium per serving fit into this eating plan.  To find whole grains, look for the word "whole" as the first word in the ingredient list. Shopping  Buy products labeled as "low-sodium" or "no salt added."  Buy fresh foods. Avoid canned foods and premade or frozen meals. Cooking  Avoid adding salt when cooking. Use salt-free seasonings or herbs instead of table salt or sea salt. Check with your health care provider or pharmacist before using salt substitutes.  Do not fry foods. Cook foods using healthy methods such as baking, boiling, grilling, and broiling instead.  Cook with  heart-healthy oils, such as olive, canola, soybean, or sunflower oil. Meal planning   Eat a balanced diet that includes: ? 5 or more servings of fruits and vegetables each day. At each meal, try to fill half of your plate with fruits and vegetables. ? Up to 6-8 servings of whole grains each day. ? Less than 6 oz of lean meat, poultry, or fish each day. A 3-oz serving of meat is about the same size as a deck of cards. One egg equals 1 oz. ? 2 servings of low-fat dairy each day. ? A serving of nuts, seeds, or beans 5 times each week. ? Heart-healthy fats. Healthy fats called Omega-3 fatty acids are found in foods such as flaxseeds and coldwater fish, like sardines, salmon, and mackerel.  Limit how much you eat of the following: ? Canned or prepackaged foods. ? Food that is high in trans fat, such as fried foods. ? Food that is high in saturated fat, such as fatty meat. ? Sweets, desserts, sugary drinks, and other foods with added sugar. ? Full-fat dairy products.  Do not salt foods before eating.  Try to eat at least 2 vegetarian meals each week.  Eat more home-cooked food and less restaurant, buffet, and fast food.  When eating at a restaurant, ask that your food be prepared with less salt or no salt, if possible. What foods are recommended? The items listed may not be a complete list. Talk with your dietitian about what   dietary choices are best for you. Grains Whole-grain or whole-wheat bread. Whole-grain or whole-wheat pasta. Brown rice. Oatmeal. Quinoa. Bulgur. Whole-grain and low-sodium cereals. Pita bread. Low-fat, low-sodium crackers. Whole-wheat flour tortillas. Vegetables Fresh or frozen vegetables (raw, steamed, roasted, or grilled). Low-sodium or reduced-sodium tomato and vegetable juice. Low-sodium or reduced-sodium tomato sauce and tomato paste. Low-sodium or reduced-sodium canned vegetables. Fruits All fresh, dried, or frozen fruit. Canned fruit in natural juice (without  added sugar). Meat and other protein foods Skinless chicken or turkey. Ground chicken or turkey. Pork with fat trimmed off. Fish and seafood. Egg whites. Dried beans, peas, or lentils. Unsalted nuts, nut butters, and seeds. Unsalted canned beans. Lean cuts of beef with fat trimmed off. Low-sodium, lean deli meat. Dairy Low-fat (1%) or fat-free (skim) milk. Fat-free, low-fat, or reduced-fat cheeses. Nonfat, low-sodium ricotta or cottage cheese. Low-fat or nonfat yogurt. Low-fat, low-sodium cheese. Fats and oils Soft margarine without trans fats. Vegetable oil. Low-fat, reduced-fat, or light mayonnaise and salad dressings (reduced-sodium). Canola, safflower, olive, soybean, and sunflower oils. Avocado. Seasoning and other foods Herbs. Spices. Seasoning mixes without salt. Unsalted popcorn and pretzels. Fat-free sweets. What foods are not recommended? The items listed may not be a complete list. Talk with your dietitian about what dietary choices are best for you. Grains Baked goods made with fat, such as croissants, muffins, or some breads. Dry pasta or rice meal packs. Vegetables Creamed or fried vegetables. Vegetables in a cheese sauce. Regular canned vegetables (not low-sodium or reduced-sodium). Regular canned tomato sauce and paste (not low-sodium or reduced-sodium). Regular tomato and vegetable juice (not low-sodium or reduced-sodium). Pickles. Olives. Fruits Canned fruit in a light or heavy syrup. Fried fruit. Fruit in cream or butter sauce. Meat and other protein foods Fatty cuts of meat. Ribs. Fried meat. Bacon. Sausage. Bologna and other processed lunch meats. Salami. Fatback. Hotdogs. Bratwurst. Salted nuts and seeds. Canned beans with added salt. Canned or smoked fish. Whole eggs or egg yolks. Chicken or turkey with skin. Dairy Whole or 2% milk, cream, and half-and-half. Whole or full-fat cream cheese. Whole-fat or sweetened yogurt. Full-fat cheese. Nondairy creamers. Whipped toppings.  Processed cheese and cheese spreads. Fats and oils Butter. Stick margarine. Lard. Shortening. Ghee. Bacon fat. Tropical oils, such as coconut, palm kernel, or palm oil. Seasoning and other foods Salted popcorn and pretzels. Onion salt, garlic salt, seasoned salt, table salt, and sea salt. Worcestershire sauce. Tartar sauce. Barbecue sauce. Teriyaki sauce. Soy sauce, including reduced-sodium. Steak sauce. Canned and packaged gravies. Fish sauce. Oyster sauce. Cocktail sauce. Horseradish that you find on the shelf. Ketchup. Mustard. Meat flavorings and tenderizers. Bouillon cubes. Hot sauce and Tabasco sauce. Premade or packaged marinades. Premade or packaged taco seasonings. Relishes. Regular salad dressings. Where to find more information:  National Heart, Lung, and Blood Institute: www.nhlbi.nih.gov  American Heart Association: www.heart.org Summary  The DASH eating plan is a healthy eating plan that has been shown to reduce high blood pressure (hypertension). It may also reduce your risk for type 2 diabetes, heart disease, and stroke.  With the DASH eating plan, you should limit salt (sodium) intake to 2,300 mg a day. If you have hypertension, you may need to reduce your sodium intake to 1,500 mg a day.  When on the DASH eating plan, aim to eat more fresh fruits and vegetables, whole grains, lean proteins, low-fat dairy, and heart-healthy fats.  Work with your health care provider or diet and nutrition specialist (dietitian) to adjust your eating plan to your individual   calorie needs. This information is not intended to replace advice given to you by your health care provider. Make sure you discuss any questions you have with your health care provider. Document Released: 12/26/2010 Document Revised: 12/31/2015 Document Reviewed: 12/31/2015 Elsevier Interactive Patient Education  2017 Elsevier Inc.  

## 2016-12-02 NOTE — Progress Notes (Signed)
   Subjective:    Patient ID: Vanessa Rojas, female    DOB: 15-Sep-1965, 51 y.o.   MRN: [id number]  Hypertension  This is a chronic problem. The current episode started more than 1 year ago. Pertinent negatives include no chest pain, headaches or shortness of breath. Compliance problems include diet and exercise.    Pt states no concerns today. Declines flu vaccine.  Patient relates that she has been doing well taking her blood pressure medicine does not take potassium.  Does states she is going to do lab work.  She was aware she will be doing a annual wellness checkup she states she will get up with her gynecologist at some point  Review of Systems  Constitutional: Negative for activity change, fatigue and fever.  HENT: Negative for congestion.   Respiratory: Negative for cough, chest tightness and shortness of breath.   Cardiovascular: Negative for chest pain and leg swelling.  Gastrointestinal: Negative for abdominal pain.  Skin: Negative for color change.  Neurological: Negative for headaches.  Psychiatric/Behavioral: Negative for behavioral problems.       Objective:   Physical Exam  Constitutional: She appears well-developed and well-nourished.  HENT:  Head: Normocephalic and atraumatic.  Eyes: Right eye exhibits no discharge. Left eye exhibits no discharge.  Cardiovascular: Normal rate, regular rhythm and normal heart sounds.  No murmur heard. Pulmonary/Chest: Effort normal and breath sounds normal. No respiratory distress. She has no wheezes. She has no rales.  Neurological: She is alert.  Skin: Skin is warm and dry.  Psychiatric: She has a normal mood and affect.  Vitals reviewed.         Assessment & Plan:  Patient declines colonoscopy stool testing for blood etc.  Blood pressure good control follow-up in 1 year to keep physically active watch diet lab work ordered may need to be on potassium depending on results  Patient encouraged to get a wellness exam and  gynecologic exam on a regular basis

## 2017-05-08 ENCOUNTER — Encounter: Payer: Self-pay | Admitting: Family Medicine

## 2017-05-22 ENCOUNTER — Other Ambulatory Visit: Payer: Self-pay | Admitting: Family Medicine

## 2017-05-24 NOTE — Telephone Encounter (Signed)
May have this +1 additional refill does need follow-up office visit

## 2017-07-20 ENCOUNTER — Other Ambulatory Visit: Payer: Self-pay | Admitting: Family Medicine

## 2017-07-21 NOTE — Telephone Encounter (Signed)
May have this +2 additional refills patient does need to do follow-up office visit

## 2017-11-04 ENCOUNTER — Other Ambulatory Visit: Payer: Self-pay | Admitting: Family Medicine

## 2017-11-07 ENCOUNTER — Other Ambulatory Visit: Payer: Self-pay | Admitting: Family Medicine

## 2017-11-08 LAB — BASIC METABOLIC PANEL
BUN / CREAT RATIO: 11 (ref 9–23)
BUN: 11 mg/dL (ref 6–24)
CO2: 29 mmol/L (ref 20–29)
Calcium: 9.4 mg/dL (ref 8.7–10.2)
Chloride: 98 mmol/L (ref 96–106)
Creatinine, Ser: 0.97 mg/dL (ref 0.57–1.00)
GFR calc non Af Amer: 67 mL/min/{1.73_m2} (ref >59)
GFR, EST AFRICAN AMERICAN: 77 mL/min/{1.73_m2} (ref >59)
Glucose: 100 mg/dL — ABNORMAL HIGH (ref 65–99)
POTASSIUM: 3.6 mmol/L (ref 3.5–5.2)
SODIUM: 143 mmol/L (ref 134–144)

## 2017-11-08 LAB — LIPID PANEL W/O CHOL/HDL RATIO
Cholesterol, Total: 178 mg/dL (ref 100–199)
HDL: 42 mg/dL (ref >39)
LDL Calculated: 111 mg/dL — ABNORMAL HIGH (ref 0–99)
Triglycerides: 126 mg/dL (ref 0–149)
VLDL Cholesterol Cal: 25 mg/dL (ref 5–40)

## 2017-11-08 LAB — SPECIMEN STATUS REPORT

## 2017-12-03 ENCOUNTER — Ambulatory Visit: Payer: BLUE CROSS/BLUE SHIELD | Admitting: Family Medicine

## 2017-12-03 ENCOUNTER — Encounter: Payer: Self-pay | Admitting: Family Medicine

## 2017-12-03 ENCOUNTER — Encounter (INDEPENDENT_AMBULATORY_CARE_PROVIDER_SITE_OTHER): Payer: Self-pay | Admitting: *Deleted

## 2017-12-03 VITALS — BP 110/76 | Ht 60.0 in | Wt 151.0 lb

## 2017-12-03 DIAGNOSIS — Z1211 Encounter for screening for malignant neoplasm of colon: Secondary | ICD-10-CM | POA: Diagnosis not present

## 2017-12-03 DIAGNOSIS — I10 Essential (primary) hypertension: Secondary | ICD-10-CM

## 2017-12-03 MED ORDER — HYDROCHLOROTHIAZIDE 25 MG PO TABS: 25.0000 mg | ORAL_TABLET | Freq: Every day | ORAL | 5 refills | Status: DC

## 2017-12-03 MED ORDER — METOPROLOL TARTRATE 100 MG PO TABS: ORAL_TABLET | ORAL | 5 refills | Status: DC

## 2017-12-03 NOTE — Addendum Note (Signed)
Addended by: Karle Barr on: 12/03/2017 10:30 AM   Modules accepted: Orders

## 2017-12-03 NOTE — Progress Notes (Addendum)
   Subjective:    Patient ID: Vanessa Rojas, female    DOB: [birth year], 52 y.o.   MRN: [id number]  HPI Patient is here today to follow up in her Htn. She is taking metoprolol 100 mg 1/2 Bid, She also takes Hctz 25 mg one per day.She says her bp at home have been doing ok.She eats healthy and exercises some.She does not see any specialist.  Patient for blood pressure check up.  The patient does have hypertension.  The patient is on medication.  Patient relates compliance with meds. Todays BP reviewed with the patient. Patient denies issues with medication. Patient relates reasonable diet. Patient tries to minimize salt. Patient aware of BP goals.  Patient does use tobacco products.  Patient knows they should quit.  Patient is aware that smoking/in use of tobacco products increases their risk of heart disease, cancer, and COPD- lung issues.  Patient has been counseled to quit smoking/tobacco products.   Review of Systems  Constitutional: Negative for activity change, fatigue and fever.  HENT: Negative for congestion and rhinorrhea.   Respiratory: Negative for cough, chest tightness and shortness of breath.   Cardiovascular: Negative for chest pain and leg swelling.  Gastrointestinal: Negative for abdominal pain and nausea.  Skin: Negative for color change.  Neurological: Negative for dizziness and headaches.  Psychiatric/Behavioral: Negative for agitation and behavioral problems.       Objective:   Physical Exam  Constitutional: She appears well-nourished. No distress.  HENT:  Head: Normocephalic.  Cardiovascular: Normal rate, regular rhythm and normal heart sounds.  No murmur heard. Pulmonary/Chest: Effort normal and breath sounds normal.  Musculoskeletal: She exhibits no edema.  Lymphadenopathy:    She has no cervical adenopathy.  Neurological: She is alert.  Psychiatric: Her behavior is normal.  Vitals reviewed.         Assessment & Plan:  HTN decent control watch diet  closely  HTN- Patient was seen today as part of a visit regarding hypertension. The importance of healthy diet and regular physical activity was discussed. The importance of compliance with medications discussed.  Ideal goal is to keep blood pressure low elevated levels certainly below 338/25 when possible.  The patient was counseled that keeping blood pressure under control lessen his risk of complications.  The importance of regular follow-ups was discussed with the patient.  Low-salt diet such as DASH recommended.  Regular physical activity was recommended as well.  Patient was advised to keep regular follow-ups.  Colonoscopy referral  Mammogram recommended number given patient resistant to doing so currently  Pap smear patient resistant she was offered to have it done here or at her gynecologist  Patient encouraged to quit smoking

## 2017-12-03 NOTE — Patient Instructions (Signed)
DASH Eating Plan DASH stands for "Dietary Approaches to Stop Hypertension." The DASH eating plan is a healthy eating plan that has been shown to reduce high blood pressure (hypertension). It may also reduce your risk for type 2 diabetes, heart disease, and stroke. The DASH eating plan may also help with weight loss. What are tips for following this plan? General guidelines  Avoid eating more than 2,300 mg (milligrams) of salt (sodium) a day. If you have hypertension, you may need to reduce your sodium intake to 1,500 mg a day.  Limit alcohol intake to no more than 1 drink a day for nonpregnant women and 2 drinks a day for men. One drink equals 12 oz of beer, 5 oz of wine, or 1 oz of hard liquor.  Work with your health care provider to maintain a healthy body weight or to lose weight. Ask what an ideal weight is for you.  Get at least 30 minutes of exercise that causes your heart to beat faster (aerobic exercise) most days of the week. Activities may include walking, swimming, or biking.  Work with your health care provider or diet and nutrition specialist (dietitian) to adjust your eating plan to your individual calorie needs. Reading food labels  Check food labels for the amount of sodium per serving. Choose foods with less than 5 percent of the Daily Value of sodium. Generally, foods with less than 300 mg of sodium per serving fit into this eating plan.  To find whole grains, look for the word "whole" as the first word in the ingredient list. Shopping  Buy products labeled as "low-sodium" or "no salt added."  Buy fresh foods. Avoid canned foods and premade or frozen meals. Cooking  Avoid adding salt when cooking. Use salt-free seasonings or herbs instead of table salt or sea salt. Check with your health care provider or pharmacist before using salt substitutes.  Do not fry foods. Cook foods using healthy methods such as baking, boiling, grilling, and broiling instead.  Cook with  heart-healthy oils, such as olive, canola, soybean, or sunflower oil. Meal planning   Eat a balanced diet that includes: ? 5 or more servings of fruits and vegetables each day. At each meal, try to fill half of your plate with fruits and vegetables. ? Up to 6-8 servings of whole grains each day. ? Less than 6 oz of lean meat, poultry, or fish each day. A 3-oz serving of meat is about the same size as a deck of cards. One egg equals 1 oz. ? 2 servings of low-fat dairy each day. ? A serving of nuts, seeds, or beans 5 times each week. ? Heart-healthy fats. Healthy fats called Omega-3 fatty acids are found in foods such as flaxseeds and coldwater fish, like sardines, salmon, and mackerel.  Limit how much you eat of the following: ? Canned or prepackaged foods. ? Food that is high in trans fat, such as fried foods. ? Food that is high in saturated fat, such as fatty meat. ? Sweets, desserts, sugary drinks, and other foods with added sugar. ? Full-fat dairy products.  Do not salt foods before eating.  Try to eat at least 2 vegetarian meals each week.  Eat more home-cooked food and less restaurant, buffet, and fast food.  When eating at a restaurant, ask that your food be prepared with less salt or no salt, if possible. What foods are recommended? The items listed may not be a complete list. Talk with your dietitian about what   dietary choices are best for you. Grains Whole-grain or whole-wheat bread. Whole-grain or whole-wheat pasta. Brown rice. Oatmeal. Quinoa. Bulgur. Whole-grain and low-sodium cereals. Pita bread. Low-fat, low-sodium crackers. Whole-wheat flour tortillas. Vegetables Fresh or frozen vegetables (raw, steamed, roasted, or grilled). Low-sodium or reduced-sodium tomato and vegetable juice. Low-sodium or reduced-sodium tomato sauce and tomato paste. Low-sodium or reduced-sodium canned vegetables. Fruits All fresh, dried, or frozen fruit. Canned fruit in natural juice (without  added sugar). Meat and other protein foods Skinless chicken or turkey. Ground chicken or turkey. Pork with fat trimmed off. Fish and seafood. Egg whites. Dried beans, peas, or lentils. Unsalted nuts, nut butters, and seeds. Unsalted canned beans. Lean cuts of beef with fat trimmed off. Low-sodium, lean deli meat. Dairy Low-fat (1%) or fat-free (skim) milk. Fat-free, low-fat, or reduced-fat cheeses. Nonfat, low-sodium ricotta or cottage cheese. Low-fat or nonfat yogurt. Low-fat, low-sodium cheese. Fats and oils Soft margarine without trans fats. Vegetable oil. Low-fat, reduced-fat, or light mayonnaise and salad dressings (reduced-sodium). Canola, safflower, olive, soybean, and sunflower oils. Avocado. Seasoning and other foods Herbs. Spices. Seasoning mixes without salt. Unsalted popcorn and pretzels. Fat-free sweets. What foods are not recommended? The items listed may not be a complete list. Talk with your dietitian about what dietary choices are best for you. Grains Baked goods made with fat, such as croissants, muffins, or some breads. Dry pasta or rice meal packs. Vegetables Creamed or fried vegetables. Vegetables in a cheese sauce. Regular canned vegetables (not low-sodium or reduced-sodium). Regular canned tomato sauce and paste (not low-sodium or reduced-sodium). Regular tomato and vegetable juice (not low-sodium or reduced-sodium). Pickles. Olives. Fruits Canned fruit in a light or heavy syrup. Fried fruit. Fruit in cream or butter sauce. Meat and other protein foods Fatty cuts of meat. Ribs. Fried meat. Bacon. Sausage. Bologna and other processed lunch meats. Salami. Fatback. Hotdogs. Bratwurst. Salted nuts and seeds. Canned beans with added salt. Canned or smoked fish. Whole eggs or egg yolks. Chicken or turkey with skin. Dairy Whole or 2% milk, cream, and half-and-half. Whole or full-fat cream cheese. Whole-fat or sweetened yogurt. Full-fat cheese. Nondairy creamers. Whipped toppings.  Processed cheese and cheese spreads. Fats and oils Butter. Stick margarine. Lard. Shortening. Ghee. Bacon fat. Tropical oils, such as coconut, palm kernel, or palm oil. Seasoning and other foods Salted popcorn and pretzels. Onion salt, garlic salt, seasoned salt, table salt, and sea salt. Worcestershire sauce. Tartar sauce. Barbecue sauce. Teriyaki sauce. Soy sauce, including reduced-sodium. Steak sauce. Canned and packaged gravies. Fish sauce. Oyster sauce. Cocktail sauce. Horseradish that you find on the shelf. Ketchup. Mustard. Meat flavorings and tenderizers. Bouillon cubes. Hot sauce and Tabasco sauce. Premade or packaged marinades. Premade or packaged taco seasonings. Relishes. Regular salad dressings. Where to find more information:  National Heart, Lung, and Blood Institute: www.nhlbi.nih.gov  American Heart Association: www.heart.org Summary  The DASH eating plan is a healthy eating plan that has been shown to reduce high blood pressure (hypertension). It may also reduce your risk for type 2 diabetes, heart disease, and stroke.  With the DASH eating plan, you should limit salt (sodium) intake to 2,300 mg a day. If you have hypertension, you may need to reduce your sodium intake to 1,500 mg a day.  When on the DASH eating plan, aim to eat more fresh fruits and vegetables, whole grains, lean proteins, low-fat dairy, and heart-healthy fats.  Work with your health care provider or diet and nutrition specialist (dietitian) to adjust your eating plan to your individual   calorie needs. This information is not intended to replace advice given to you by your health care provider. Make sure you discuss any questions you have with your health care provider. Document Released: 12/26/2010 Document Revised: 12/31/2015 Document Reviewed: 12/31/2015 Elsevier Interactive Patient Education  [employer].   As part of today's visit a referral has been made. This is a process that is handled by our  clinical referral specialists. This process requires that we send your medical information to the specialists for their review before they will issue you an appointment.  Unfortunately this does take time and much of this process is under the responsibility of the specialists.  For emergent referrals we do our best to speed up this process. Our referral specialist will make certain that your insurance company preferred list is taken into account. Emergent referrals are made as quick as possible.  Most standard referrals often take 10-14 days before the specialists office will connect with you to set up the appointment.. If you have not heard when your appointment is from [place] or the referral specialists within 10-14 days please call us regarding this referral.   Steps to Quit Smoking Smoking tobacco can be bad for your health. It can also affect almost every organ in your body. Smoking puts you and people around you at risk for many serious long-lasting (chronic) diseases. Quitting smoking is hard, but it is one of the best things that you can do for your health. It is never too late to quit. What are the benefits of quitting smoking? When you quit smoking, you lower your risk for getting serious diseases and conditions. They can include:  Lung cancer or lung disease.  Heart disease.  Stroke.  Heart attack.  Not being able to have children (infertility).  Weak bones (osteoporosis) and broken bones (fractures).  If you have coughing, wheezing, and shortness of breath, those symptoms may get better when you quit. You may also get sick less often. If you are pregnant, quitting smoking can help to lower your chances of having a baby of low birth weight. What can I do to help me quit smoking? Talk with your doctor about what can help you quit smoking. Some things you can do (strategies) include:  Quitting smoking totally, instead of slowly cutting back how much you smoke over a period of  time.  Going to in-person counseling. You are more likely to quit if you go to many counseling sessions.  Using resources and support systems, such as: ? Database administrator with a Social worker. ? Phone quitlines. ? Careers information officer. ? Support groups or group counseling. ? Text messaging programs. ? Mobile phone apps or applications.  Taking medicines. Some of these medicines may have nicotine in them. If you are pregnant or breastfeeding, do not take any medicines to quit smoking unless your doctor says it is okay. Talk with your doctor about counseling or other things that can help you.  Talk with your doctor about using more than one strategy at the same time, such as taking medicines while you are also going to in-person counseling. This can help make quitting easier. What things can I do to make it easier to quit? Quitting smoking might feel very hard at first, but there is a lot that you can do to make it easier. Take these steps:  Talk to your family and friends. Ask them to support and encourage you.  Call phone quitlines, reach out to support groups, or work with  a Social worker.  Ask people who smoke to not smoke around you.  Avoid places that make you want (trigger) to smoke, such as: ? Bars. ? Parties. ? Smoke-break areas at work.  Spend time with people who do not smoke.  Lower the stress in your life. Stress can make you want to smoke. Try these things to help your stress: ? Getting regular exercise. ? Deep-breathing exercises. ? Yoga. ? Meditating. ? Doing a body scan. To do this, close your eyes, focus on one area of your body at a time from head to toe, and notice which parts of your body are tense. Try to relax the muscles in those areas.  Download or buy apps on your mobile phone or tablet that can help you stick to your quit plan. There are many free apps, such as QuitGuide from the [employer] Office manager for Disease Control and Prevention). You can find more support from  smokefree.gov and other websites.  This information is not intended to replace advice given to you by your health care provider. Make sure you discuss any questions you have with your health care provider. Document Released: 11/02/2008 Document Revised: 09/04/2015 Document Reviewed: 05/23/2014 Elsevier Interactive Patient Education  2018 [employer].

## 2018-05-28 ENCOUNTER — Ambulatory Visit (INDEPENDENT_AMBULATORY_CARE_PROVIDER_SITE_OTHER): Payer: BLUE CROSS/BLUE SHIELD | Admitting: Family Medicine

## 2018-05-28 ENCOUNTER — Other Ambulatory Visit: Payer: Self-pay

## 2018-05-28 DIAGNOSIS — M542 Cervicalgia: Secondary | ICD-10-CM

## 2018-05-28 DIAGNOSIS — E7849 Other hyperlipidemia: Secondary | ICD-10-CM | POA: Diagnosis not present

## 2018-05-28 DIAGNOSIS — I10 Essential (primary) hypertension: Secondary | ICD-10-CM | POA: Diagnosis not present

## 2018-05-28 MED ORDER — METOPROLOL TARTRATE 100 MG PO TABS: ORAL_TABLET | ORAL | 5 refills | Status: DC

## 2018-05-28 MED ORDER — HYDROCHLOROTHIAZIDE 25 MG PO TABS: 25.0000 mg | ORAL_TABLET | Freq: Every day | ORAL | 5 refills | Status: DC

## 2018-05-28 MED ORDER — DICLOFENAC SODIUM 75 MG PO TBEC: DELAYED_RELEASE_TABLET | ORAL | 0 refills | Status: DC

## 2018-05-28 NOTE — Progress Notes (Signed)
   Subjective:    Patient ID: Vanessa Rojas, female    DOB: 05-11-1965, 53 y.o.   MRN: [id number]  Hypertension  This is a chronic problem. There are no compliance problems.   Pt states she is able to take BP on a regular basis. Patient for blood pressure check up.  The patient does have hypertension.  The patient is on medication.  Patient relates compliance with meds. Todays BP reviewed with the patient. Patient denies issues with medication. Patient relates reasonable diet. Patient tries to minimize salt. Patient aware of BP goals.  Patient is trying to watch her diet try and lose some weight she is trying to stay safe from coronavirus  She does have a history of slightly elevated cholesterol has not been on statin but we will need to relook at her lab work Pt states she is having irritation with her neck. Irritation started about 1 week ago. Pt states that right at bottom of neck feels swollen.  Pt has been taking ibuprofen. She denies any radiation down the arms.  Denies any lumps or growths in her neck she states it feels somewhat swollen in the back with some pain and discomfort she thinks it was related to doing a lot of painting Virtual Visit via Video Note  I connected with Vanessa Rojas on 05/28/18 at 10:00 AM EDT by a video enabled telemedicine application and verified that I am speaking with the correct person using two identifiers.  Location: Patient: home Provider: office   I discussed the limitations of evaluation and management by telemedicine and the availability of in person appointments. The patient expressed understanding and agreed to proceed.  History of Present Illness:    Observations/Objective:   Assessment and Plan:   Follow Up Instructions:    I discussed the assessment and treatment plan with the patient. The patient was provided an opportunity to ask questions and all were answered. The patient agreed with the plan and demonstrated an understanding  of the instructions.   The patient was advised to call back or seek an in-person evaluation if the symptoms worsen or if the condition fails to improve as anticipated.  I provided 17 minutes of non-face-to-face time during this encounter.   Vicente Males, LPN     Review of Systems     Objective:   Physical Exam        Assessment & Plan:  Neck inflammation probably a strained muscle hard to know for certain I do not find evidence of an impinged nerve at this point I recommend gentle range of motion exercises along with massage cool compresses plus also prescription anti-inflammatory Voltaren 75 mg twice daily for the next 2 weeks Patient will send [place] a MyChart update in 2 weeks time  If she is getting worse she will need to do a follow-up office visit  Blood pressure overall doing well continue current measures  Hyperlipidemia patient encouraged to watch diet and check lab work in the summer plus patient will do a follow-up office visit with [place] in the fall

## 2018-11-11 ENCOUNTER — Other Ambulatory Visit: Payer: Self-pay | Admitting: Family Medicine

## 2018-11-12 NOTE — Telephone Encounter (Signed)
30-day with 1 additional refill needs follow-up by the end of the year

## 2019-01-10 ENCOUNTER — Other Ambulatory Visit: Payer: Self-pay | Admitting: Family Medicine

## 2019-01-10 NOTE — Telephone Encounter (Signed)
Please contact patient to set up appt; then may route back to nurses. Thank you  

## 2019-01-10 NOTE — Telephone Encounter (Signed)
Pt scheduled 12/28

## 2019-01-17 ENCOUNTER — Encounter: Payer: Self-pay | Admitting: Family Medicine

## 2019-01-17 ENCOUNTER — Ambulatory Visit (INDEPENDENT_AMBULATORY_CARE_PROVIDER_SITE_OTHER): Payer: Self-pay | Admitting: Family Medicine

## 2019-01-17 DIAGNOSIS — Z1231 Encounter for screening mammogram for malignant neoplasm of breast: Secondary | ICD-10-CM

## 2019-01-17 DIAGNOSIS — I10 Essential (primary) hypertension: Secondary | ICD-10-CM

## 2019-01-17 DIAGNOSIS — Z1589 Genetic susceptibility to other disease: Secondary | ICD-10-CM

## 2019-01-17 DIAGNOSIS — Z1211 Encounter for screening for malignant neoplasm of colon: Secondary | ICD-10-CM

## 2019-01-17 DIAGNOSIS — I6529 Occlusion and stenosis of unspecified carotid artery: Secondary | ICD-10-CM

## 2019-01-17 DIAGNOSIS — Z79899 Other long term (current) drug therapy: Secondary | ICD-10-CM

## 2019-01-17 DIAGNOSIS — M542 Cervicalgia: Secondary | ICD-10-CM

## 2019-01-17 MED ORDER — METOPROLOL TARTRATE 100 MG PO TABS: ORAL_TABLET | ORAL | 5 refills | Status: DC

## 2019-01-17 MED ORDER — DICLOFENAC SODIUM 75 MG PO TBEC: DELAYED_RELEASE_TABLET | ORAL | 1 refills | Status: DC

## 2019-01-17 MED ORDER — HYDROCHLOROTHIAZIDE 25 MG PO TABS: 25.0000 mg | ORAL_TABLET | Freq: Every day | ORAL | 5 refills | Status: DC

## 2019-01-17 NOTE — Progress Notes (Signed)
   Subjective:    Patient ID: Vanessa Rojas, female    DOB: [birth year], 53 y.o.   MRN: [id number]  Hypertension This is a chronic problem. Pertinent negatives include no chest pain or shortness of breath. Treatments tried: hctz, metoprolol. Compliance problems include diet (walks for exercise and takes meds every day, could do better with diet).    Neck and shoulder pain. Started 2 weeks ago but has had in the and used diclofenac and it helped.  Patient relates pain in the neck radiates into both shoulders does not radiate down the arms no known injury injury patient had this previously earlier this year took anti-inflammatories it did help she would like to have more anti-inflammatories  Patient states her diet could be better worse she could do better with exercising she will watch salt in her diet  I did counsel her regarding quitting smoking and encourage her to quit  Virtual Visit via Telephone Note  I connected with Vanessa Rojas on 01/17/19 at  2:00 PM EST by telephone and verified that I am speaking with the correct person using two identifiers.  Location: Patient: home Provider: office   I discussed the limitations, risks, security and privacy concerns of performing an evaluation and management service by telephone and the availability of in person appointments. I also discussed with the patient that there may be a patient responsible charge related to this service. The patient expressed understanding and agreed to proceed.   History of Present Illness:    Observations/Objective:   Assessment and Plan:   Follow Up Instructions:    I discussed the assessment and treatment plan with the patient. The patient was provided an opportunity to ask questions and all were answered. The patient agreed with the plan and demonstrated an understanding of the instructions.   The patient was advised to call back or seek an in-person evaluation if the symptoms worsen or if the  condition fails to improve as anticipated.  I provided 17 minutes of non-face-to-face time during this encounter.       Review of Systems  Constitutional: Negative for activity change, appetite change and fatigue.  HENT: Negative for congestion and rhinorrhea.   Respiratory: Negative for cough and shortness of breath.   Cardiovascular: Negative for chest pain and leg swelling.  Gastrointestinal: Negative for abdominal pain and diarrhea.  Endocrine: Negative for polydipsia and polyphagia.  Musculoskeletal: Positive for arthralgias and back pain.  Skin: Negative for color change.  Neurological: Negative for dizziness and weakness.  Psychiatric/Behavioral: Negative for behavioral problems and confusion.       Objective:   Physical Exam Today's visit was via telephone Physical exam was not possible for this visit        Assessment & Plan:  I encouraged the patient to do a wellness exam plus also colonoscopy lab work mammogram after the turn it year she states she will have insurance in January  Patient was also encouraged to quit smoking  Blood pressure is stable according to patient refills given  With her HLA-B27 positive I do recommend lab work but she can wait till after January want to do this anti-inflammatory sent and it is possible that some of this could be spondylolysis issues

## 2019-01-24 ENCOUNTER — Ambulatory Visit (HOSPITAL_COMMUNITY)
Admission: RE | Admit: 2019-01-24 | Discharge: 2019-01-24 | Disposition: A | Discharge status: Home / Self Care | Payer: BLUE CROSS/BLUE SHIELD | Source: Ambulatory Visit | Attending: Family Medicine | Admitting: Family Medicine

## 2019-01-24 ENCOUNTER — Encounter (HOSPITAL_COMMUNITY): Payer: Self-pay

## 2019-01-24 ENCOUNTER — Other Ambulatory Visit: Payer: Self-pay

## 2019-01-24 ENCOUNTER — Encounter: Payer: Self-pay | Admitting: Family Medicine

## 2019-01-24 DIAGNOSIS — M542 Cervicalgia: Secondary | ICD-10-CM | POA: Diagnosis present

## 2019-01-24 DIAGNOSIS — Z1589 Genetic susceptibility to other disease: Secondary | ICD-10-CM | POA: Diagnosis present

## 2019-01-24 NOTE — Telephone Encounter (Signed)
Nurses  Please order C-spine x-rays due to cervical pain and positive HLA B 27  Please notify the patient and inform the patient how to go about getting her x-rays completed thank you 

## 2019-01-24 NOTE — Telephone Encounter (Signed)
Xray orders placed and pt is aware

## 2019-01-24 NOTE — Addendum Note (Signed)
Addended by: Vicente Males on: 01/24/2019 10:46 AM   Modules accepted: Orders

## 2019-01-24 NOTE — Progress Notes (Signed)
Lab orders placed, mammogram scheduled and GI referral placed. Pt contacted and is aware. Sent pt a my chart message with mammogram date (01/27/2019 at 2 pm).

## 2019-01-26 NOTE — Addendum Note (Signed)
Addended by: Vicente Males on: 01/26/2019 08:45 AM   Modules accepted: Orders

## 2019-01-27 ENCOUNTER — Encounter: Payer: Self-pay | Admitting: Family Medicine

## 2019-01-27 ENCOUNTER — Ambulatory Visit (HOSPITAL_COMMUNITY)
Admission: RE | Admit: 2019-01-27 | Discharge: 2019-01-27 | Disposition: A | Discharge status: Home / Self Care | Payer: BLUE CROSS/BLUE SHIELD | Source: Ambulatory Visit | Attending: Family Medicine | Admitting: Family Medicine

## 2019-01-27 ENCOUNTER — Encounter (INDEPENDENT_AMBULATORY_CARE_PROVIDER_SITE_OTHER): Payer: Self-pay | Admitting: *Deleted

## 2019-01-27 ENCOUNTER — Other Ambulatory Visit: Payer: Self-pay

## 2019-01-27 DIAGNOSIS — Z1231 Encounter for screening mammogram for malignant neoplasm of breast: Secondary | ICD-10-CM | POA: Insufficient documentation

## 2019-01-28 LAB — BASIC METABOLIC PANEL
BUN/Creatinine Ratio: 16 (ref 9–23)
BUN: 18 mg/dL (ref 6–24)
CO2: 26 mmol/L (ref 20–29)
Calcium: 9.4 mg/dL (ref 8.7–10.2)
Chloride: 102 mmol/L (ref 96–106)
Creatinine, Ser: 1.12 mg/dL — ABNORMAL HIGH (ref 0.57–1.00)
GFR calc Af Amer: 64 mL/min/{1.73_m2} (ref >59)
GFR calc non Af Amer: 56 mL/min/{1.73_m2} — ABNORMAL LOW (ref >59)
Glucose: 93 mg/dL (ref 65–99)
Potassium: 3.8 mmol/L (ref 3.5–5.2)
Sodium: 141 mmol/L (ref 134–144)

## 2019-01-28 LAB — LIPID PANEL
Chol/HDL Ratio: 4.8 ratio — ABNORMAL HIGH (ref 0.0–4.4)
Cholesterol, Total: 186 mg/dL (ref 100–199)
HDL: 39 mg/dL — ABNORMAL LOW (ref >39)
LDL Chol Calc (NIH): 123 mg/dL — ABNORMAL HIGH (ref 0–99)
Triglycerides: 134 mg/dL (ref 0–149)
VLDL Cholesterol Cal: 24 mg/dL (ref 5–40)

## 2019-01-28 LAB — C-REACTIVE PROTEIN: CRP: 9 mg/L (ref 0–10)

## 2019-02-02 ENCOUNTER — Ambulatory Visit (HOSPITAL_COMMUNITY): Payer: BLUE CROSS/BLUE SHIELD

## 2019-02-03 ENCOUNTER — Encounter: Payer: Self-pay | Admitting: Family Medicine

## 2019-02-20 ENCOUNTER — Encounter: Payer: Self-pay | Admitting: Family Medicine

## 2019-02-20 DIAGNOSIS — M459 Ankylosing spondylitis of unspecified sites in spine: Secondary | ICD-10-CM

## 2019-02-20 HISTORY — DX: Ankylosing spondylitis of unspecified sites in spine: M45.9

## 2019-02-22 ENCOUNTER — Encounter: Payer: Self-pay | Admitting: Family Medicine

## 2019-03-08 ENCOUNTER — Encounter: Payer: Self-pay | Admitting: Family Medicine

## 2019-03-08 DIAGNOSIS — E538 Deficiency of other specified B group vitamins: Secondary | ICD-10-CM

## 2019-03-09 ENCOUNTER — Encounter: Payer: Self-pay | Admitting: Family Medicine

## 2019-03-09 DIAGNOSIS — E538 Deficiency of other specified B group vitamins: Secondary | ICD-10-CM

## 2019-03-09 HISTORY — DX: Deficiency of other specified B group vitamins: E53.8

## 2019-03-09 NOTE — Telephone Encounter (Signed)
B12 low normal Please connect with patient Set her up for B12 shots as a nurse visit here  I would recommend B12, 1 cc IM, once weekly for the next 8 weeks then after that oral B12 1000 mcg orally daily would be fine Also recommend repeating B12 level in 12 weeks after starting the shots

## 2019-03-15 ENCOUNTER — Other Ambulatory Visit (INDEPENDENT_AMBULATORY_CARE_PROVIDER_SITE_OTHER): Payer: Self-pay | Admitting: *Deleted

## 2019-03-15 ENCOUNTER — Other Ambulatory Visit: Payer: Self-pay | Admitting: *Deleted

## 2019-03-15 ENCOUNTER — Other Ambulatory Visit: Payer: Self-pay

## 2019-03-15 DIAGNOSIS — E538 Deficiency of other specified B group vitamins: Secondary | ICD-10-CM

## 2019-03-15 MED ORDER — CYANOCOBALAMIN 1000 MCG/ML IJ SOLN
1000.0000 ug | Freq: Once | INTRAMUSCULAR | Status: AC
  Administered 2019-03-15: 1000 ug via INTRAMUSCULAR

## 2019-03-23 ENCOUNTER — Other Ambulatory Visit (INDEPENDENT_AMBULATORY_CARE_PROVIDER_SITE_OTHER): Payer: BLUE CROSS/BLUE SHIELD | Admitting: *Deleted

## 2019-03-23 ENCOUNTER — Other Ambulatory Visit: Payer: Self-pay

## 2019-03-23 DIAGNOSIS — E538 Deficiency of other specified B group vitamins: Secondary | ICD-10-CM | POA: Diagnosis not present

## 2019-03-23 MED ORDER — CYANOCOBALAMIN 1000 MCG/ML IJ SOLN
1000.0000 ug | Freq: Once | INTRAMUSCULAR | Status: AC
  Administered 2019-03-23: 1000 ug via INTRAMUSCULAR

## 2019-03-29 ENCOUNTER — Other Ambulatory Visit (INDEPENDENT_AMBULATORY_CARE_PROVIDER_SITE_OTHER): Payer: Self-pay

## 2019-03-29 ENCOUNTER — Other Ambulatory Visit: Payer: Self-pay

## 2019-03-29 DIAGNOSIS — E538 Deficiency of other specified B group vitamins: Secondary | ICD-10-CM

## 2019-03-29 MED ORDER — CYANOCOBALAMIN 1000 MCG/ML IJ SOLN
1000.0000 ug | Freq: Once | INTRAMUSCULAR | Status: AC
  Administered 2019-03-29: 1000 ug via INTRAMUSCULAR

## 2019-04-05 ENCOUNTER — Other Ambulatory Visit (INDEPENDENT_AMBULATORY_CARE_PROVIDER_SITE_OTHER): Payer: Self-pay | Admitting: *Deleted

## 2019-04-05 ENCOUNTER — Other Ambulatory Visit: Payer: Self-pay

## 2019-04-05 DIAGNOSIS — E538 Deficiency of other specified B group vitamins: Secondary | ICD-10-CM

## 2019-04-05 MED ORDER — CYANOCOBALAMIN 1000 MCG/ML IJ SOLN
1000.0000 ug | Freq: Once | INTRAMUSCULAR | Status: AC
  Administered 2019-04-05: 1000 ug via INTRAMUSCULAR

## 2019-04-12 ENCOUNTER — Other Ambulatory Visit: Payer: Self-pay

## 2019-04-12 ENCOUNTER — Other Ambulatory Visit (INDEPENDENT_AMBULATORY_CARE_PROVIDER_SITE_OTHER): Payer: Self-pay | Admitting: *Deleted

## 2019-04-12 DIAGNOSIS — E538 Deficiency of other specified B group vitamins: Secondary | ICD-10-CM

## 2019-04-12 MED ORDER — CYANOCOBALAMIN 1000 MCG/ML IJ SOLN
1000.0000 ug | Freq: Once | INTRAMUSCULAR | Status: AC
  Administered 2019-04-12: 1000 ug via INTRAMUSCULAR

## 2019-04-19 ENCOUNTER — Other Ambulatory Visit (INDEPENDENT_AMBULATORY_CARE_PROVIDER_SITE_OTHER): Payer: Self-pay

## 2019-04-19 ENCOUNTER — Other Ambulatory Visit: Payer: Self-pay

## 2019-04-19 DIAGNOSIS — E538 Deficiency of other specified B group vitamins: Secondary | ICD-10-CM

## 2019-04-19 MED ORDER — CYANOCOBALAMIN 1000 MCG/ML IJ SOLN
1000.0000 ug | Freq: Once | INTRAMUSCULAR | Status: AC
  Administered 2019-04-19: 1000 ug via INTRAMUSCULAR

## 2019-04-26 ENCOUNTER — Other Ambulatory Visit (INDEPENDENT_AMBULATORY_CARE_PROVIDER_SITE_OTHER): Payer: Self-pay | Admitting: *Deleted

## 2019-04-26 ENCOUNTER — Other Ambulatory Visit: Payer: Self-pay

## 2019-04-26 DIAGNOSIS — E538 Deficiency of other specified B group vitamins: Secondary | ICD-10-CM

## 2019-04-26 MED ORDER — CYANOCOBALAMIN 1000 MCG/ML IJ SOLN
1000.0000 ug | Freq: Once | INTRAMUSCULAR | Status: AC
  Administered 2019-04-26: 1000 ug via INTRAMUSCULAR

## 2019-05-04 ENCOUNTER — Other Ambulatory Visit (INDEPENDENT_AMBULATORY_CARE_PROVIDER_SITE_OTHER): Payer: Self-pay | Admitting: *Deleted

## 2019-05-04 ENCOUNTER — Other Ambulatory Visit: Payer: Self-pay

## 2019-05-04 DIAGNOSIS — E538 Deficiency of other specified B group vitamins: Secondary | ICD-10-CM

## 2019-05-04 MED ORDER — CYANOCOBALAMIN 1000 MCG/ML IJ SOLN
1000.0000 ug | Freq: Once | INTRAMUSCULAR | Status: AC
  Administered 2019-05-04: 1000 ug via INTRAMUSCULAR

## 2019-05-24 ENCOUNTER — Encounter: Payer: Self-pay | Admitting: Family Medicine

## 2019-05-24 ENCOUNTER — Other Ambulatory Visit: Payer: Self-pay

## 2019-05-24 ENCOUNTER — Telehealth: Payer: Self-pay | Admitting: *Deleted

## 2019-05-24 ENCOUNTER — Ambulatory Visit (INDEPENDENT_AMBULATORY_CARE_PROVIDER_SITE_OTHER): Payer: BLUE CROSS/BLUE SHIELD | Admitting: Family Medicine

## 2019-05-24 DIAGNOSIS — I10 Essential (primary) hypertension: Secondary | ICD-10-CM

## 2019-05-24 DIAGNOSIS — R0789 Other chest pain: Secondary | ICD-10-CM

## 2019-05-24 DIAGNOSIS — K219 Gastro-esophageal reflux disease without esophagitis: Secondary | ICD-10-CM

## 2019-05-24 DIAGNOSIS — E538 Deficiency of other specified B group vitamins: Secondary | ICD-10-CM

## 2019-05-24 DIAGNOSIS — E876 Hypokalemia: Secondary | ICD-10-CM

## 2019-05-24 HISTORY — DX: Hypokalemia: E87.6

## 2019-05-24 MED ORDER — FLUCONAZOLE 200 MG PO TABS: 200.0000 mg | ORAL_TABLET | Freq: Every day | ORAL | 0 refills | Status: DC

## 2019-05-24 MED ORDER — PANTOPRAZOLE SODIUM 40 MG PO TBEC: 40.0000 mg | DELAYED_RELEASE_TABLET | Freq: Every day | ORAL | 5 refills | Status: DC

## 2019-05-24 MED ORDER — POTASSIUM CHLORIDE CRYS ER 20 MEQ PO TBCR: 20.0000 meq | EXTENDED_RELEASE_TABLET | Freq: Every morning | ORAL | 5 refills | Status: DC

## 2019-05-24 MED ORDER — SUCRALFATE 1 G PO TABS: ORAL_TABLET | ORAL | 2 refills | Status: DC

## 2019-05-24 NOTE — Telephone Encounter (Signed)
[name], ferner are scheduled for a virtual visit with your provider today.    Just as we do with appointments in the office, we must obtain your consent to participate.  Your consent will be active for this visit and any virtual visit you may have with one of our providers in the next 365 days.    If you have a MyChart account, I can also send a copy of this consent to you electronically.  All virtual visits are billed to your insurance company just like a traditional visit in the office.  As this is a virtual visit, video technology does not allow for your provider to perform a traditional examination.  This may limit your provider's ability to fully assess your condition.  If your provider identifies any concerns that need to be evaluated in person or the need to arrange testing such as labs, EKG, etc, we will make arrangements to do so.    Although advances in technology are sophisticated, we cannot ensure that it will always work on either your end or our end.  If the connection with a video visit is poor, we may have to switch to a telephone visit.  With either a video or telephone visit, we are not always able to ensure that we have a secure connection.   I need to obtain your verbal consent now.   Are you willing to proceed with your visit today?   Vanessa Rojas has provided verbal consent on 05/24/2019 for a virtual visit (video or telephone).   Dayton Bailiff, LPN [phone]  [phone] AM

## 2019-05-24 NOTE — Progress Notes (Signed)
   Subjective:    Patient ID: Vanessa Rojas, female    DOB: [birth year], 54 y.o.   MRN: [id number] Initially was virtual but then patient brought to the office to do EKG and physical exam HPIpt states she has been having a lot of ingestion. Worse at night. Burning in throat. Tried otc prilosec for 14 days and no relief.  Belch Up the throat Worse with laying down 2 a.m. chest feels heavy takes tums and it helps At least once or more regurg then gets burning and gagging No vomiting Moderate nausea when it happrens Tried Tums and prilosec During the day burning  In the upper chest Eating no difference Feels sx when drinking water No doe  Virtual Visit via Telephone Note  I connected with Vanessa Rojas on 05/24/19 at  1:10 PM EDT by telephone and verified that I am speaking with the correct person using two identifiers.  Location: Patient: home Provider: office   I discussed the limitations, risks, security and privacy concerns of performing an evaluation and management service by telephone and the availability of in person appointments. I also discussed with the patient that there may be a patient responsible charge related to this service. The patient expressed understanding and agreed to proceed.   History of Present Illness:    Observations/Objective:   Assessment and Plan:   Follow Up Instructions:    I discussed the assessment and treatment plan with the patient. The patient was provided an opportunity to ask questions and all were answered. The patient agreed with the plan and demonstrated an understanding of the instructions.   The patient was advised to call back or seek an in-person evaluation if the symptoms worsen or if the condition fails to improve as anticipated.  I provided 20 minutes of non-face-to-face time during this encounter.      Review of Systems Patient relates esophageal discomfort when she swallows when she drinks when she eats but denies  food getting stuck denies fever chills sweats    Objective:   Physical Exam  Lungs clear respiratory rate normal heart regular some epigastric discomfort EKG no acute changes.     Assessment & Plan:  Initially virtual visit but then patient was brought to the office for further evaluation for  Reflux related issues will go with a PPI plus also Carafate regular basis minimize caffeine's avoid tomato based products Bed on 6 inch blocks  Intermittent chest pain more than likely esophagitis but will do EKG await results  Patient on Humira it is possible patient may be dealing with yeast esophagitis will go ahead with Diflucan to cover for that possibility patient does not want to undergo EGD currently

## 2019-07-05 ENCOUNTER — Other Ambulatory Visit: Payer: Self-pay

## 2019-07-05 MED ORDER — METOPROLOL TARTRATE 100 MG PO TABS: ORAL_TABLET | ORAL | 5 refills | Status: DC

## 2019-07-06 ENCOUNTER — Other Ambulatory Visit: Payer: Self-pay | Admitting: *Deleted

## 2019-07-07 NOTE — Telephone Encounter (Signed)
May have 30-day with 1 refill needs follow-up visit

## 2019-07-08 MED ORDER — HYDROCHLOROTHIAZIDE 25 MG PO TABS: 25.0000 mg | ORAL_TABLET | Freq: Every day | ORAL | 1 refills | Status: DC

## 2019-07-08 NOTE — Telephone Encounter (Signed)
Scheduled 7/29

## 2019-07-08 NOTE — Telephone Encounter (Signed)
Please schedule and then route back thanks

## 2019-07-15 LAB — BASIC METABOLIC PANEL
BUN/Creatinine Ratio: 13 (ref 9–23)
BUN: 13 mg/dL (ref 6–24)
CO2: 28 mmol/L (ref 20–29)
Calcium: 9.9 mg/dL (ref 8.7–10.2)
Chloride: 100 mmol/L (ref 96–106)
Creatinine, Ser: 0.97 mg/dL (ref 0.57–1.00)
GFR calc Af Amer: 76 mL/min/{1.73_m2} (ref >59)
GFR calc non Af Amer: 66 mL/min/{1.73_m2} (ref >59)
Glucose: 106 mg/dL — ABNORMAL HIGH (ref 65–99)
Potassium: 3.3 mmol/L — ABNORMAL LOW (ref 3.5–5.2)
Sodium: 143 mmol/L (ref 134–144)

## 2019-07-15 LAB — MAGNESIUM: Magnesium: 2 mg/dL (ref 1.6–2.3)

## 2019-07-15 LAB — VITAMIN B12: Vitamin B-12: 1639 pg/mL — ABNORMAL HIGH (ref 232–1245)

## 2019-07-18 ENCOUNTER — Other Ambulatory Visit: Payer: Self-pay | Admitting: *Deleted

## 2019-07-18 MED ORDER — POTASSIUM CHLORIDE CRYS ER 20 MEQ PO TBCR: 20.0000 meq | EXTENDED_RELEASE_TABLET | Freq: Two times a day (BID) | ORAL | 5 refills | Status: DC

## 2019-07-29 ENCOUNTER — Other Ambulatory Visit: Payer: Self-pay | Admitting: Family Medicine

## 2019-08-18 ENCOUNTER — Encounter: Payer: Self-pay | Admitting: Family Medicine

## 2019-08-18 ENCOUNTER — Ambulatory Visit (INDEPENDENT_AMBULATORY_CARE_PROVIDER_SITE_OTHER): Payer: Self-pay | Admitting: Family Medicine

## 2019-08-18 ENCOUNTER — Other Ambulatory Visit: Payer: Self-pay

## 2019-08-18 VITALS — BP 128/74 | Temp 97.3°F | Ht 60.0 in | Wt 156.0 lb

## 2019-08-18 DIAGNOSIS — I10 Essential (primary) hypertension: Secondary | ICD-10-CM

## 2019-08-18 DIAGNOSIS — Z1211 Encounter for screening for malignant neoplasm of colon: Secondary | ICD-10-CM

## 2019-08-18 DIAGNOSIS — E876 Hypokalemia: Secondary | ICD-10-CM

## 2019-08-18 MED ORDER — HYDROCHLOROTHIAZIDE 25 MG PO TABS: 25.0000 mg | ORAL_TABLET | Freq: Every day | ORAL | 5 refills | Status: DC

## 2019-08-18 MED ORDER — PANTOPRAZOLE SODIUM 40 MG PO TBEC: 40.0000 mg | DELAYED_RELEASE_TABLET | Freq: Every day | ORAL | 5 refills | Status: DC

## 2019-08-18 MED ORDER — POTASSIUM CHLORIDE ER 10 MEQ PO TBCR: EXTENDED_RELEASE_TABLET | ORAL | 5 refills | Status: DC

## 2019-08-18 NOTE — Progress Notes (Signed)
   Subjective:    Patient ID: Vanessa Rojas, female    DOB: 1965/03/27, 54 y.o.   MRN: [id number]  HPImed check up. Pt states no concerns today.   Patient taking her medicine as directed Watching diet staying active Trying to be healthy Recently got insurance Is interested in getting her colonoscopy.  Review of Systems  Constitutional: Negative for activity change, appetite change and fatigue.  HENT: Negative for congestion and rhinorrhea.   Respiratory: Negative for cough and shortness of breath.   Cardiovascular: Negative for chest pain and leg swelling.  Gastrointestinal: Negative for abdominal pain and diarrhea.  Endocrine: Negative for polydipsia and polyphagia.  Skin: Negative for color change.  Neurological: Negative for dizziness and weakness.  Psychiatric/Behavioral: Negative for behavioral problems and confusion.       Objective:   Physical Exam Vitals reviewed.  Constitutional:      General: She is not in acute distress. HENT:     Head: Normocephalic and atraumatic.  Eyes:     General:        Right eye: No discharge.        Left eye: No discharge.  Neck:     Trachea: No tracheal deviation.  Cardiovascular:     Rate and Rhythm: Normal rate and regular rhythm.     Heart sounds: Normal heart sounds. No murmur heard.   Pulmonary:     Effort: Pulmonary effort is normal. No respiratory distress.     Breath sounds: Normal breath sounds.  Lymphadenopathy:     Cervical: No cervical adenopathy.  Skin:    General: Skin is warm and dry.  Neurological:     Mental Status: She is alert.     Coordination: Coordination normal.  Psychiatric:        Behavior: Behavior normal.           Assessment & Plan:  1. Essential hypertension Blood pressure good control continue current measures  2. Hypokalemia Change potassium to a smaller capsule make it easier for the patient to take  3. Screening for colon cancer Referral for colonoscopy she needs it to be in  network - Ambulatory referral to Gastroenterology Follow-up 6 months

## 2019-08-18 NOTE — Progress Notes (Deleted)
1 

## 2019-08-18 NOTE — Patient Instructions (Signed)
DASH Eating Plan DASH stands for "Dietary Approaches to Stop Hypertension." The DASH eating plan is a healthy eating plan that has been shown to reduce high blood pressure (hypertension). It may also reduce your risk for type 2 diabetes, heart disease, and stroke. The DASH eating plan may also help with weight loss. What are tips for following this plan?  General guidelines  Avoid eating more than 2,300 mg (milligrams) of salt (sodium) a day. If you have hypertension, you may need to reduce your sodium intake to 1,500 mg a day.  Limit alcohol intake to no more than 1 drink a day for nonpregnant women and 2 drinks a day for men. One drink equals 12 oz of beer, 5 oz of wine, or 1 oz of hard liquor.  Work with your health care provider to maintain a healthy body weight or to lose weight. Ask what an ideal weight is for you.  Get at least 30 minutes of exercise that causes your heart to beat faster (aerobic exercise) most days of the week. Activities may include walking, swimming, or biking.  Work with your health care provider or diet and nutrition specialist (dietitian) to adjust your eating plan to your individual calorie needs. Reading food labels   Check food labels for the amount of sodium per serving. Choose foods with less than 5 percent of the Daily Value of sodium. Generally, foods with less than 300 mg of sodium per serving fit into this eating plan.  To find whole grains, look for the word "whole" as the first word in the ingredient list. Shopping  Buy products labeled as "low-sodium" or "no salt added."  Buy fresh foods. Avoid canned foods and premade or frozen meals. Cooking  Avoid adding salt when cooking. Use salt-free seasonings or herbs instead of table salt or sea salt. Check with your health care provider or pharmacist before using salt substitutes.  Do not fry foods. Cook foods using healthy methods such as baking, boiling, grilling, and broiling instead.  Cook with  heart-healthy oils, such as olive, canola, soybean, or sunflower oil. Meal planning  Eat a balanced diet that includes: ? 5 or more servings of fruits and vegetables each day. At each meal, try to fill half of your plate with fruits and vegetables. ? Up to 6-8 servings of whole grains each day. ? Less than 6 oz of lean meat, poultry, or fish each day. A 3-oz serving of meat is about the same size as a deck of cards. One egg equals 1 oz. ? 2 servings of low-fat dairy each day. ? A serving of nuts, seeds, or beans 5 times each week. ? Heart-healthy fats. Healthy fats called Omega-3 fatty acids are found in foods such as flaxseeds and coldwater fish, like sardines, salmon, and mackerel.  Limit how much you eat of the following: ? Canned or prepackaged foods. ? Food that is high in trans fat, such as fried foods. ? Food that is high in saturated fat, such as fatty meat. ? Sweets, desserts, sugary drinks, and other foods with added sugar. ? Full-fat dairy products.  Do not salt foods before eating.  Try to eat at least 2 vegetarian meals each week.  Eat more home-cooked food and less restaurant, buffet, and fast food.  When eating at a restaurant, ask that your food be prepared with less salt or no salt, if possible. What foods are recommended? The items listed may not be a complete list. Talk with your dietitian about   what dietary choices are best for you. Grains Whole-grain or whole-wheat bread. Whole-grain or whole-wheat pasta. Brown rice. Oatmeal. Quinoa. Bulgur. Whole-grain and low-sodium cereals. Pita bread. Low-fat, low-sodium crackers. Whole-wheat flour tortillas. Vegetables Fresh or frozen vegetables (raw, steamed, roasted, or grilled). Low-sodium or reduced-sodium tomato and vegetable juice. Low-sodium or reduced-sodium tomato sauce and tomato paste. Low-sodium or reduced-sodium canned vegetables. Fruits All fresh, dried, or frozen fruit. Canned fruit in natural juice (without  added sugar). Meat and other protein foods Skinless chicken or turkey. Ground chicken or turkey. Pork with fat trimmed off. Fish and seafood. Egg whites. Dried beans, peas, or lentils. Unsalted nuts, nut butters, and seeds. Unsalted canned beans. Lean cuts of beef with fat trimmed off. Low-sodium, lean deli meat. Dairy Low-fat (1%) or fat-free (skim) milk. Fat-free, low-fat, or reduced-fat cheeses. Nonfat, low-sodium ricotta or cottage cheese. Low-fat or nonfat yogurt. Low-fat, low-sodium cheese. Fats and oils Soft margarine without trans fats. Vegetable oil. Low-fat, reduced-fat, or light mayonnaise and salad dressings (reduced-sodium). Canola, safflower, olive, soybean, and sunflower oils. Avocado. Seasoning and other foods Herbs. Spices. Seasoning mixes without salt. Unsalted popcorn and pretzels. Fat-free sweets. What foods are not recommended? The items listed may not be a complete list. Talk with your dietitian about what dietary choices are best for you. Grains Baked goods made with fat, such as croissants, muffins, or some breads. Dry pasta or rice meal packs. Vegetables Creamed or fried vegetables. Vegetables in a cheese sauce. Regular canned vegetables (not low-sodium or reduced-sodium). Regular canned tomato sauce and paste (not low-sodium or reduced-sodium). Regular tomato and vegetable juice (not low-sodium or reduced-sodium). Pickles. Olives. Fruits Canned fruit in a light or heavy syrup. Fried fruit. Fruit in cream or butter sauce. Meat and other protein foods Fatty cuts of meat. Ribs. Fried meat. Bacon. Sausage. Bologna and other processed lunch meats. Salami. Fatback. Hotdogs. Bratwurst. Salted nuts and seeds. Canned beans with added salt. Canned or smoked fish. Whole eggs or egg yolks. Chicken or turkey with skin. Dairy Whole or 2% milk, cream, and half-and-half. Whole or full-fat cream cheese. Whole-fat or sweetened yogurt. Full-fat cheese. Nondairy creamers. Whipped toppings.  Processed cheese and cheese spreads. Fats and oils Butter. Stick margarine. Lard. Shortening. Ghee. Bacon fat. Tropical oils, such as coconut, palm kernel, or palm oil. Seasoning and other foods Salted popcorn and pretzels. Onion salt, garlic salt, seasoned salt, table salt, and sea salt. Worcestershire sauce. Tartar sauce. Barbecue sauce. Teriyaki sauce. Soy sauce, including reduced-sodium. Steak sauce. Canned and packaged gravies. Fish sauce. Oyster sauce. Cocktail sauce. Horseradish that you find on the shelf. Ketchup. Mustard. Meat flavorings and tenderizers. Bouillon cubes. Hot sauce and Tabasco sauce. Premade or packaged marinades. Premade or packaged taco seasonings. Relishes. Regular salad dressings. Where to find more information:  National Heart, Lung, and Blood Institute: www.nhlbi.nih.gov  American Heart Association: www.heart.org Summary  The DASH eating plan is a healthy eating plan that has been shown to reduce high blood pressure (hypertension). It may also reduce your risk for type 2 diabetes, heart disease, and stroke.  With the DASH eating plan, you should limit salt (sodium) intake to 2,300 mg a day. If you have hypertension, you may need to reduce your sodium intake to 1,500 mg a day.  When on the DASH eating plan, aim to eat more fresh fruits and vegetables, whole grains, lean proteins, low-fat dairy, and heart-healthy fats.  Work with your health care provider or diet and nutrition specialist (dietitian) to adjust your eating plan to your   individual calorie needs. This information is not intended to replace advice given to you by your health care provider. Make sure you discuss any questions you have with your health care provider. Document Revised: 12/19/2016 Document Reviewed: 12/31/2015 Elsevier Patient Education  2020 Elsevier Inc.  

## 2019-08-25 ENCOUNTER — Encounter: Payer: Self-pay | Admitting: Family Medicine

## 2019-09-05 ENCOUNTER — Encounter: Payer: Self-pay | Admitting: Internal Medicine

## 2019-10-19 ENCOUNTER — Other Ambulatory Visit: Payer: Self-pay

## 2019-10-19 ENCOUNTER — Ambulatory Visit (INDEPENDENT_AMBULATORY_CARE_PROVIDER_SITE_OTHER): Payer: Self-pay | Admitting: *Deleted

## 2019-10-19 VITALS — Ht 60.0 in | Wt 157.6 lb

## 2019-10-19 DIAGNOSIS — Z1211 Encounter for screening for malignant neoplasm of colon: Secondary | ICD-10-CM

## 2019-10-19 MED ORDER — NA SULFATE-K SULFATE-MG SULF 17.5-3.13-1.6 GM/177ML PO SOLN
1.0000 | Freq: Once | ORAL | 0 refills | Status: AC
Start: 2019-10-19 — End: 2019-10-19

## 2019-10-19 NOTE — Progress Notes (Addendum)
Gastroenterology Pre-Procedure Review  Request Date: 10/19/2019 Requesting Physician: Dr. Wolfgang Phoenix, Last TCS done 1990 by Dr. Posey Pronto, no polyps per pt  PATIENT REVIEW QUESTIONS: The patient responded to the following health history questions as indicated:    1. Diabetes Melitis: no 2. Joint replacements in the past 12 months: no 3. Major health problems in the past 3 months: no 4. Has an artificial valve or MVP: no 5. Has a defibrillator: no 6. Has been advised in past to take antibiotics in advance of a procedure like teeth cleaning: no 7. Family history of colon cancer: no  8. Alcohol Use: no 9. Illicit drug Use: no 10. History of sleep apnea: no  11. History of coronary artery or other vascular stents placed within the last 12 months: no 12. History of any prior anesthesia complications: no 13. Body mass index is 30.78 kg/m.    MEDICATIONS & ALLERGIES:    Patient reports the following regarding taking any blood thinners:   Plavix? no Aspirin? no Coumadin? no Brilinta? no Xarelto? no Eliquis? no Pradaxa? no Savaysa? no Effient? no  Patient confirms/reports the following medications:  Current Outpatient Medications  Medication Sig Dispense Refill  . cyanocobalamin 1000 MCG tablet Take 1,000 mcg by mouth daily.    Marland Kitchen HUMIRA PEN 40 MG/0.4ML PNKT SMARTSIG:40 Milligram(s) SUB-Q Every 2 Weeks    . hydrochlorothiazide (HYDRODIURIL) 25 MG tablet Take 1 tablet (25 mg total) by mouth daily. 30 tablet 5  . metoprolol tartrate (LOPRESSOR) 100 MG tablet Take one half tablet bid 30 tablet 5  . pantoprazole (PROTONIX) 40 MG tablet Take 1 tablet (40 mg total) by mouth daily. 30 tablet 5  . potassium chloride (KLOR-CON) 10 MEQ tablet Take 2 tablets twice per day 120 tablet 5  . Na Sulfate-K Sulfate-Mg Sulf 17.5-3.13-1.6 GM/177ML SOLN Take 1 kit by mouth once for 1 dose. 354 mL 0  . potassium chloride SA (KLOR-CON) 20 MEQ tablet Take 1 tablet (20 mEq total) by mouth 2 (two) times daily.  (Patient not taking: Reported on 10/19/2019) 30 tablet 5   No current facility-administered medications for this visit.    Patient confirms/reports the following allergies:  Allergies  Allergen Reactions  . Penicillins Other (See Comments)    Childhood allergy.  . Wellbutrin [Bupropion] Nausea And Vomiting    No orders of the defined types were placed in this encounter.   AUTHORIZATION INFORMATION Primary Insurance: [employer],  [place] #: [id number] Pre-Cert / Auth required: No, not required (checked online)  SCHEDULE INFORMATION: Procedure has been scheduled as follows:  Date: 11/14/2019, Time: 10:30 Location: [facility] with Dr. Abbey Chatters  This Gastroenterology Pre-Precedure Review Form is being routed to the following provider(s): Roseanne Kaufman, NP

## 2019-10-19 NOTE — Patient Instructions (Signed)
Vanessa Rojas  [birth year] MRN: [id number]     Procedure Date: 11/14/2019 Time to register: 9:00 am Place to register: [facility] Procedure Time: 10:30 am Scheduled provider: Dr. Abbey Chatters    PREPARATION FOR COLONOSCOPY WITH SUPREP BOWEL PREP KIT  Note: Suprep Bowel Prep Kit is a split-dose (2day) regimen. Consumption of BOTH 6-ounce bottles is required for a complete prep.  Please notify us immediately if you are diabetic, take iron supplements, or if you are on Coumadin or any other blood thinners.  Please hold the following medications: n/a                                                                                                                                                  2 DAYS BEFORE PROCEDURE:  DATE: 11/12/2019   DAY: Saturday Begin clear liquid diet AFTER your lunch meal. NO SOLID FOODS after this point.  1 DAY BEFORE PROCEDURE:  DATE: 11/13/2019   DAY: Sunday Continue clear liquids the entire day - NO SOLID FOOD.   Diabetic medications adjustments for today: n/a  At 6:00pm: Complete steps 1 through 4 below, using ONE (1) 6-ounce bottle, before going to bed. Step 1:  Pour ONE (1) 6-ounce bottle of SUPREP liquid into the mixing container.  Step 2:  Add cool drinking water to the 16 ounce line on the container and mix.  Note: Dilute the solution concentrate as directed prior to use. Step 3:  DRINK ALL the liquid in the container. Step 4:  You MUST drink an additional two (2) or more 16 ounce containers of water over the next one (1) hour.   Continue clear liquids.  DAY OF PROCEDURE:   DATE: 11/14/2019   DAY: Monday If you take medications for your heart, blood pressure, or breathing, you may take these medications.  Diabetic medications adjustments for today: n/a  5 hours before your procedure at :  5:30 am Step 1:  Pour ONE (1) 6-ounce bottle of SUPREP liquid into the mixing container.  Step 2:  Add cool drinking water to the 16 ounce line on the  container and mix.  Note: Dilute the solution concentrate as directed prior to use. Step 3:  DRINK ALL the liquid in the container. Step 4:  You MUST drink an additional two (2) or more 16 ounce containers of water over the next one (1) hour. You MUST complete the final glass of water at least 3 hours before your colonoscopy. Nothing by mouth past 7:30 am.  You may take your morning medications with sip of water unless we have instructed otherwise.    Please see below for Dietary Information.  CLEAR LIQUIDS INCLUDE:  Water Jello (NOT red in color)   Ice Popsicles (NOT red in color)   Tea (sugar ok, no milk/cream) Powdered fruit flavored drinks  Coffee (sugar  ok, no milk/cream) Gatorade/ Lemonade/ Kool-Aid  (NOT red in color)   Juice: apple, white grape, white cranberry Soft drinks  Clear bullion, consomme, broth (fat free beef/chicken/vegetable)  Carbonated beverages (any kind)  Strained chicken noodle soup Hard Candy   Remember: Clear liquids are liquids that will allow you to see your fingers on the other side of a clear glass. Be sure liquids are NOT red in color, and not cloudy, but CLEAR.  DO NOT EAT OR DRINK ANY OF THE FOLLOWING:  Dairy products of any kind   Cranberry juice Tomato juice / V8 juice   Grapefruit juice Orange juice     Red grape juice  Do not eat any solid foods, including such foods as: cereal, oatmeal, yogurt, fruits, vegetables, creamed soups, eggs, bread, crackers, pureed foods in a blender, etc.   HELPFUL HINTS FOR DRINKING PREP SOLUTION:   Make sure prep is extremely cold. Mix and refrigerate the the morning of the prep. You may also put in the freezer.   You may try mixing some Crystal Light or Country Time Lemonade if you prefer. Mix in small amounts; add more if necessary.  Try drinking through a straw  Rinse mouth with water or a mouthwash between glasses, to remove after-taste.  Try sipping on a cold beverage /ice/ popsicles between glasses of  prep.  Place a piece of sugar-free hard candy in mouth between glasses.  If you become nauseated, try consuming smaller amounts, or stretch out the time between glasses. Stop for 30-60 minutes, then slowly start back drinking.     OTHER INSTRUCTIONS  You will need a responsible adult at least 55 years of age to accompany you and drive you home. This person must remain in the waiting room during your procedure. The hospital will cancel your procedure if you do not have a responsible adult with you.   1. Wear loose fitting clothing that is easily removed. 2. Leave jewelry and other valuables at home.  3. Remove all body piercing jewelry and leave at home. 4. Total time from sign-in until discharge is approximately 2-3 hours. 5. You should go home directly after your procedure and rest. You can resume normal activities the day after your procedure. 6. The day of your procedure you should not:  Drive  Make legal decisions  Operate machinery  Drink alcohol  Return to work   You may call the office (Dept: [phone]) before 5:00pm, or page the doctor on call [phone]) after 5:00pm, for further instructions, if necessary.   Insurance Information YOU WILL NEED TO CHECK WITH YOUR INSURANCE COMPANY FOR THE BENEFITS OF COVERAGE YOU HAVE FOR THIS PROCEDURE.  UNFORTUNATELY, NOT ALL INSURANCE COMPANIES HAVE BENEFITS TO COVER ALL OR PART OF THESE TYPES OF PROCEDURES.  IT IS YOUR RESPONSIBILITY TO CHECK YOUR BENEFITS, HOWEVER, WE WILL BE GLAD TO ASSIST YOU WITH ANY CODES YOUR INSURANCE COMPANY MAY NEED.    PLEASE NOTE THAT MOST INSURANCE COMPANIES WILL NOT COVER A SCREENING COLONOSCOPY FOR PEOPLE UNDER THE AGE OF 50  IF YOU HAVE [employer] INSURANCE, YOU MAY HAVE BENEFITS FOR A SCREENING COLONOSCOPY BUT IF POLYPS ARE FOUND THE DIAGNOSIS WILL CHANGE AND THEN YOU MAY HAVE A DEDUCTIBLE THAT WILL NEED TO BE MET. SO PLEASE MAKE SURE YOU CHECK YOUR BENEFITS FOR A SCREENING COLONOSCOPY AS WELL AS A  DIAGNOSTIC COLONOSCOPY.

## 2019-10-26 NOTE — Progress Notes (Signed)
ASA 2. Appropriate.  ?

## 2019-10-27 ENCOUNTER — Other Ambulatory Visit: Payer: Self-pay | Admitting: *Deleted

## 2019-11-01 ENCOUNTER — Encounter: Payer: Self-pay | Admitting: Family Medicine

## 2019-11-08 ENCOUNTER — Ambulatory Visit (INDEPENDENT_AMBULATORY_CARE_PROVIDER_SITE_OTHER): Payer: PRIVATE HEALTH INSURANCE | Admitting: Family Medicine

## 2019-11-08 ENCOUNTER — Encounter: Payer: Self-pay | Admitting: Family Medicine

## 2019-11-08 ENCOUNTER — Other Ambulatory Visit: Payer: Self-pay

## 2019-11-08 ENCOUNTER — Ambulatory Visit (HOSPITAL_COMMUNITY)
Admission: RE | Admit: 2019-11-08 | Discharge: 2019-11-08 | Disposition: A | Discharge status: Home / Self Care | Payer: PRIVATE HEALTH INSURANCE | Source: Ambulatory Visit | Attending: Family Medicine | Admitting: Family Medicine

## 2019-11-08 VITALS — HR 72 | Temp 97.7°F

## 2019-11-08 DIAGNOSIS — R059 Cough, unspecified: Secondary | ICD-10-CM

## 2019-11-08 DIAGNOSIS — J019 Acute sinusitis, unspecified: Secondary | ICD-10-CM | POA: Diagnosis not present

## 2019-11-08 MED ORDER — PREDNISONE 20 MG PO TABS: ORAL_TABLET | ORAL | 0 refills | Status: DC

## 2019-11-08 MED ORDER — AZITHROMYCIN 250 MG PO TABS: ORAL_TABLET | ORAL | 0 refills | Status: DC

## 2019-11-08 MED ORDER — BENZONATATE 100 MG PO CAPS: 100.0000 mg | ORAL_CAPSULE | Freq: Three times a day (TID) | ORAL | 0 refills | Status: DC | PRN

## 2019-11-08 MED ORDER — ALBUTEROL SULFATE (2.5 MG/3ML) 0.083% IN NEBU: 2.5000 mg | INHALATION_SOLUTION | RESPIRATORY_TRACT | 12 refills | Status: DC | PRN

## 2019-11-08 NOTE — Progress Notes (Signed)
   Subjective:    Patient ID: Vanessa Rojas, female    DOB: July 06, 1965, 54 y.o.   MRN: [id number]  Cough This is a new problem. Episode onset: 5 weeks. The cough is non-productive. Associated symptoms comments: Sob sometimes. Treatments tried: otc cough med.   Patient has had multiple weeks of coughing congestion denies high fever chills sweats energy level overall doing halfway decent but does get a little out of breath if she tries to push herself.  Patient is a smoker.  She states she does try to stay relatively healthy.   Review of Systems  Respiratory: Positive for cough.    Given that this is gone on for about 5 weeks I doubt that this is Covid    Objective:   Physical Exam Bilateral coarse cough noted no rales or rhonchi HEENT is benign O2 saturation 97% no respiratory distress       Assessment & Plan:  Due to the nature of her cough plus also being on immunosuppressants I do recommend that this patient go ahead with a chest x-ray Also go ahead with antibiotics prednisone taper albuterol I believe she is having most likely a post viral bronchitis that is causing asthma equivalent coughing.

## 2019-11-10 ENCOUNTER — Encounter (HOSPITAL_COMMUNITY): Payer: Self-pay | Admitting: *Deleted

## 2019-11-11 ENCOUNTER — Other Ambulatory Visit: Payer: Self-pay

## 2019-11-11 ENCOUNTER — Other Ambulatory Visit (HOSPITAL_COMMUNITY)
Admission: RE | Admit: 2019-11-11 | Discharge: 2019-11-11 | Disposition: A | Discharge status: Home / Self Care | Payer: PRIVATE HEALTH INSURANCE | Source: Ambulatory Visit | Attending: Internal Medicine | Admitting: Internal Medicine

## 2019-11-11 DIAGNOSIS — Z20822 Contact with and (suspected) exposure to covid-19: Secondary | ICD-10-CM | POA: Insufficient documentation

## 2019-11-11 DIAGNOSIS — Z01812 Encounter for preprocedural laboratory examination: Secondary | ICD-10-CM | POA: Insufficient documentation

## 2019-11-11 LAB — BASIC METABOLIC PANEL
Anion gap: 11 (ref 5–15)
BUN: 24 mg/dL — ABNORMAL HIGH (ref 6–20)
CO2: 28 mmol/L (ref 22–32)
Calcium: 9.2 mg/dL (ref 8.9–10.3)
Chloride: 98 mmol/L (ref 98–111)
Creatinine, Ser: 1.28 mg/dL — ABNORMAL HIGH (ref 0.44–1.00)
GFR, Estimated: 49 mL/min — ABNORMAL LOW (ref >60)
Glucose, Bld: 125 mg/dL — ABNORMAL HIGH (ref 70–99)
Potassium: 3.4 mmol/L — ABNORMAL LOW (ref 3.5–5.1)
Sodium: 137 mmol/L (ref 135–145)

## 2019-11-12 LAB — SARS CORONAVIRUS 2 (TAT 6-24 HRS): SARS Coronavirus 2: NEGATIVE

## 2019-11-14 ENCOUNTER — Ambulatory Visit (HOSPITAL_COMMUNITY): Payer: PRIVATE HEALTH INSURANCE | Admitting: Anesthesiology

## 2019-11-14 ENCOUNTER — Ambulatory Visit (HOSPITAL_COMMUNITY)
Admission: RE | Admit: 2019-11-14 | Discharge: 2019-11-14 | Disposition: A | Discharge status: Home / Self Care | Payer: PRIVATE HEALTH INSURANCE | Attending: Internal Medicine | Admitting: Internal Medicine

## 2019-11-14 ENCOUNTER — Other Ambulatory Visit: Payer: Self-pay

## 2019-11-14 ENCOUNTER — Encounter (HOSPITAL_COMMUNITY): Payer: Self-pay | Admitting: *Deleted

## 2019-11-14 ENCOUNTER — Encounter (HOSPITAL_COMMUNITY)
Admission: RE | Disposition: A | Discharge status: Home / Self Care | Payer: Self-pay | Source: Home / Self Care | Attending: Internal Medicine

## 2019-11-14 DIAGNOSIS — M459 Ankylosing spondylitis of unspecified sites in spine: Secondary | ICD-10-CM | POA: Insufficient documentation

## 2019-11-14 DIAGNOSIS — K219 Gastro-esophageal reflux disease without esophagitis: Secondary | ICD-10-CM | POA: Insufficient documentation

## 2019-11-14 DIAGNOSIS — Z1211 Encounter for screening for malignant neoplasm of colon: Secondary | ICD-10-CM | POA: Diagnosis present

## 2019-11-14 DIAGNOSIS — D125 Benign neoplasm of sigmoid colon: Secondary | ICD-10-CM | POA: Insufficient documentation

## 2019-11-14 DIAGNOSIS — K635 Polyp of colon: Secondary | ICD-10-CM | POA: Diagnosis not present

## 2019-11-14 DIAGNOSIS — D123 Benign neoplasm of transverse colon: Secondary | ICD-10-CM | POA: Insufficient documentation

## 2019-11-14 DIAGNOSIS — I10 Essential (primary) hypertension: Secondary | ICD-10-CM | POA: Diagnosis not present

## 2019-11-14 DIAGNOSIS — J45909 Unspecified asthma, uncomplicated: Secondary | ICD-10-CM | POA: Insufficient documentation

## 2019-11-14 DIAGNOSIS — D124 Benign neoplasm of descending colon: Secondary | ICD-10-CM | POA: Diagnosis not present

## 2019-11-14 DIAGNOSIS — Z8249 Family history of ischemic heart disease and other diseases of the circulatory system: Secondary | ICD-10-CM | POA: Diagnosis not present

## 2019-11-14 DIAGNOSIS — Z800 Family history of malignant neoplasm of digestive organs: Secondary | ICD-10-CM

## 2019-11-14 DIAGNOSIS — F17210 Nicotine dependence, cigarettes, uncomplicated: Secondary | ICD-10-CM | POA: Insufficient documentation

## 2019-11-14 DIAGNOSIS — Z8673 Personal history of transient ischemic attack (TIA), and cerebral infarction without residual deficits: Secondary | ICD-10-CM | POA: Insufficient documentation

## 2019-11-14 DIAGNOSIS — D128 Benign neoplasm of rectum: Secondary | ICD-10-CM | POA: Insufficient documentation

## 2019-11-14 DIAGNOSIS — K648 Other hemorrhoids: Secondary | ICD-10-CM | POA: Insufficient documentation

## 2019-11-14 DIAGNOSIS — Z7952 Long term (current) use of systemic steroids: Secondary | ICD-10-CM | POA: Insufficient documentation

## 2019-11-14 HISTORY — PX: COLONOSCOPY WITH PROPOFOL: SHX5780

## 2019-11-14 HISTORY — PX: POLYPECTOMY: SHX5525

## 2019-11-14 SURGERY — COLONOSCOPY WITH PROPOFOL
Anesthesia: General

## 2019-11-14 MED ORDER — CHLORHEXIDINE GLUCONATE CLOTH 2 % EX PADS: 6.0000 | MEDICATED_PAD | Freq: Once | CUTANEOUS | Status: DC

## 2019-11-14 MED ORDER — LIDOCAINE HCL (CARDIAC) PF 50 MG/5ML IV SOSY
PREFILLED_SYRINGE | INTRAVENOUS | Status: DC | PRN
  Administered 2019-11-14: 100 mg via INTRAVENOUS

## 2019-11-14 MED ORDER — LACTATED RINGERS IV SOLN: Freq: Once | INTRAVENOUS | Status: AC

## 2019-11-14 MED ORDER — PROPOFOL 500 MG/50ML IV EMUL
INTRAVENOUS | Status: DC | PRN
  Administered 2019-11-14 (×2): 100 ug/kg/min via INTRAVENOUS

## 2019-11-14 MED ORDER — SIMETHICONE 40 MG/0.6ML PO SUSP
ORAL | Status: AC
  Filled 2019-11-14: qty 0.6

## 2019-11-14 MED ORDER — PROPOFOL 10 MG/ML IV BOLUS
INTRAVENOUS | Status: DC | PRN
  Administered 2019-11-14: 100 mg via INTRAVENOUS
  Administered 2019-11-14: 80 mg via INTRAVENOUS
  Administered 2019-11-14: 20 mg via INTRAVENOUS

## 2019-11-14 MED ORDER — STERILE WATER FOR IRRIGATION IR SOLN
Status: DC | PRN
  Administered 2019-11-14: 100 mL

## 2019-11-14 MED ORDER — LACTATED RINGERS IV SOLN: INTRAVENOUS | Status: DC | PRN

## 2019-11-14 NOTE — Op Note (Signed)
[facility] Patient Name: Vanessa Rojas Procedure Date: 11/14/2019 11:14 AM MRN: [id number] Date of Birth: 05-10-1965 Attending MD: Elon Alas. Abbey Chatters DO CSN: [id number] Age: 54 Admit Type: Outpatient Procedure:                Colonoscopy Indications:              Screening in patient at increased risk: Family                            history of 1st-degree relative with colorectal                            cancer before age 51 years Providers:                Elon Alas. Abbey Chatters, DO, Crystal Page, Randa Spike, Technician Referring MD:              Medicines:                See the Anesthesia note for documentation of the                            administered medications Complications:            No immediate complications. Estimated Blood Loss:     Estimated blood loss was minimal. Procedure:                Pre-Anesthesia Assessment:                           - The anesthesia plan was to use monitored                            anesthesia care (MAC).                           After obtaining informed consent, the colonoscope                            was passed under direct vision. Throughout the                            procedure, the patient's blood pressure, pulse, and                            oxygen saturations were monitored continuously. The                            PCF-HQ190L ([id number]) scope was introduced through                            the anus and advanced to the the cecum, identified                            by appendiceal orifice and ileocecal valve. The  colonoscopy was performed without difficulty. The                            patient tolerated the procedure well. The quality                            of the bowel preparation was evaluated using the                            BBPS [facility] Bowel Preparation Scale) with scores                            of: Right Colon = 3, Transverse Colon =  3 and Left                            Colon = 3 (entire mucosa seen well with no residual                            staining, small fragments of stool or opaque                            liquid). The total BBPS score equals 9. Scope In: Scope Out: Findings:      The perianal and digital rectal examinations were normal.      Non-bleeding internal hemorrhoids were found during endoscopy.      Three sessile polyps were found in the transverse colon. The polyps were       5 to 8 mm in size. These polyps were removed with a cold snare.       Resection and retrieval were complete.      Three flat polyps were found in the descending colon. The polyps were 7       to 9 mm in size. These polyps were removed with a cold snare. Resection       and retrieval were complete.      A 10 mm polyp was found in the sigmoid colon. The polyp was sessile. The       polyp was removed with a hot snare. Resection and retrieval were       complete.      A 12 mm polyp was found in the sigmoid colon. The polyp was       pedunculated. The polyp was removed with a hot snare. Resection and       retrieval were complete.      Three sessile polyps were found in the sigmoid colon. The polyps were 5       to 8 mm in size. These polyps were removed with a cold snare. Resection       and retrieval were complete.      Seven sessile polyps were found in the recto-sigmoid colon. The polyps       were 4 to 6 mm in size. These polyps were removed with a cold snare.       Resection and retrieval were complete. Impression:               - Non-bleeding internal hemorrhoids.                           -  Three 5 to 8 mm polyps in the transverse colon,                            removed with a cold snare. Resected and retrieved.                           - Three 7 to 9 mm polyps in the descending colon,                            removed with a cold snare. Resected and retrieved.                           - One 10 mm polyp in the  sigmoid colon, removed                            with a hot snare. Resected and retrieved.                           - One 12 mm polyp in the sigmoid colon, removed                            with a hot snare. Resected and retrieved.                           - Three 5 to 8 mm polyps in the sigmoid colon,                            removed with a cold snare. Resected and retrieved.                           - Seven 4 to 6 mm polyps at the recto-sigmoid                            colon, removed with a cold snare. Resected and                            retrieved.                           - Multiple polyps left behind in the recosigmoid                            colon. Favor hyperplastic polyps. Will await                            pathology. Moderate Sedation:      Per Anesthesia Care Recommendation:           - Patient has a contact number available for                            emergencies. The signs and symptoms of potential  delayed complications were discussed with the                            patient. Return to normal activities tomorrow.                            Written discharge instructions were provided to the                            patient.                           - Resume previous diet.                           - Continue present medications.                           - Await pathology results.                           - Repeat colonoscopy at appointment to be scheduled                            for surveillance based on pathology results.                           - Return to GI clinic in 2 weeks with Dr. Abbey Chatters. Procedure Code(s):        --- Professional ---                           [phone], Colonoscopy, flexible; with removal of                            tumor(s), polyp(s), or other lesion(s) by snare                            technique Diagnosis Code(s):        --- Professional ---                           K63.5, Polyp of  colon                           Z80.0, Family history of malignant neoplasm of                            digestive organs                           K64.8, Other hemorrhoids CPT copyright 2019 American Medical Association. All rights reserved. The codes documented in this report are preliminary and upon coder review may  be revised to meet current compliance requirements. Elon Alas. Abbey Chatters, DO [place] Abbey Chatters, DO 11/14/2019 12:16:53 PM This report has been signed electronically. Number of Addenda: 0

## 2019-11-14 NOTE — Anesthesia Preprocedure Evaluation (Addendum)
Anesthesia Evaluation  Patient identified by MRN, date of birth, ID band Patient awake    Reviewed: Allergy & Precautions, NPO status , Patient's Chart, lab work & pertinent test results, reviewed documented beta blocker date and time   History of Anesthesia Complications Negative for: history of anesthetic complications  Airway Mallampati: III  TM Distance: >3 FB Neck ROM: Full    Dental  (+) Dental Advisory Given [place] :   Pulmonary asthma , Current SmokerPatient did not abstain from smoking.,    Pulmonary exam normal breath sounds clear to auscultation       Cardiovascular Exercise Tolerance: Good hypertension, Pt. on medications and Pt. on home beta blockers Normal cardiovascular exam Rhythm:Regular Rate:Normal     Neuro/Psych TIA   GI/Hepatic Neg liver ROS, GERD  Medicated and Controlled,  Endo/Other  negative endocrine ROS  Renal/GU negative Renal ROS     Musculoskeletal  (+) Arthritis  (ankylosing spondylitis),   Abdominal   Peds  Hematology negative hematology ROS (+)   Anesthesia Other Findings   Reproductive/Obstetrics                            Anesthesia Physical Anesthesia Plan  ASA: III  Anesthesia Plan: General   Post-op Pain Management:    Induction: Intravenous  PONV Risk Score and Plan: TIVA  Airway Management Planned: Nasal Cannula, Natural Airway and Simple Face Mask  Additional Equipment:   Intra-op Plan:   Post-operative Plan:   Informed Consent: I have reviewed the patients History and Physical, chart, labs and discussed the procedure including the risks, benefits and alternatives for the proposed anesthesia with the patient or authorized representative who has indicated his/her understanding and acceptance.     Dental advisory given  Plan Discussed with: CRNA and Surgeon  Anesthesia Plan Comments:         Anesthesia Quick Evaluation

## 2019-11-14 NOTE — Anesthesia Postprocedure Evaluation (Signed)
Anesthesia Post Note  Patient: Vanessa Rojas  Procedure(s) Performed: COLONOSCOPY WITH PROPOFOL (N/A ) POLYPECTOMY  Patient location during evaluation: Endoscopy Anesthesia Type: General Level of consciousness: awake and alert and patient cooperative Pain management: satisfactory to patient Vital Signs Assessment: post-procedure vital signs reviewed and stable Respiratory status: spontaneous breathing Cardiovascular status: stable Postop Assessment: no apparent nausea or vomiting Anesthetic complications: no   No complications documented.   Last Vitals:  Vitals:   11/14/19 0910 11/14/19 1109  BP: (!) 152/76 (!) 111/5  Pulse: (!) 50 (!) 56  Resp: (!) 22 18  Temp: 36.6 C 36.4 C  SpO2: 98% 99%    Last Pain:  Vitals:   11/14/19 1109  TempSrc: Oral  PainSc: 0-No pain                 Mrk Buzby

## 2019-11-14 NOTE — Discharge Instructions (Addendum)
Colon Polyps  Polyps are tissue growths inside the body. Polyps can grow in many places, including the large intestine (colon). A polyp may be a round bump or a mushroom-shaped growth. You could have one polyp or several. Most colon polyps are noncancerous (benign). However, some colon polyps can become cancerous over time. Finding and removing the polyps early can help prevent this. What are the causes? The exact cause of colon polyps is not known. What increases the risk? You are more likely to develop this condition if you:  Have a family history of colon cancer or colon polyps.  Are older than 55 or older than 45 if you are African American.  Have inflammatory bowel disease, such as ulcerative colitis or Crohn's disease.  Have certain hereditary conditions, such as: ? Familial adenomatous polyposis. ? Lynch syndrome. ? Turcot syndrome. ? Peutz-Jeghers syndrome.  Are overweight.  Smoke cigarettes.  Do not get enough exercise.  Drink too much alcohol.  Eat a diet that is high in fat and red meat and low in fiber.  Had childhood cancer that was treated with abdominal radiation. What are the signs or symptoms? Most polyps do not cause symptoms. If you have symptoms, they may include:  Blood coming from your rectum when having a bowel movement.  Blood in your stool. The stool may look dark red or black.  Abdominal pain.  A change in bowel habits, such as constipation or diarrhea. How is this diagnosed? This condition is diagnosed with a colonoscopy. This is a procedure in which a lighted, flexible scope is inserted into the anus and then passed into the colon to examine the area. Polyps are sometimes found when a colonoscopy is done as part of routine cancer screening tests. How is this treated? Treatment for this condition involves removing any polyps that are found. Most polyps can be removed during a colonoscopy. Those polyps will then be tested for cancer. Additional  treatment may be needed depending on the results of testing. Follow these instructions at home: Lifestyle  Maintain a healthy weight, or lose weight if recommended by your health care provider.  Exercise every day or as told by your health care provider.  Do not use any products that contain nicotine or tobacco, such as cigarettes and e-cigarettes. If you need help quitting, ask your health care provider.  If you drink alcohol, limit how much you have: ? 0-1 drink a day for women. ? 0-2 drinks a day for men.  Be aware of how much alcohol is in your drink. In the [place] one drink equals one 12 oz bottle of beer (355 mL), one 5 oz glass of wine (148 mL), or one 1 oz shot of hard liquor (44 mL). Eating and drinking   Eat foods that are high in fiber, such as fruits, vegetables, and whole grains.  Eat foods that are high in calcium and vitamin D, such as milk, cheese, yogurt, eggs, liver, fish, and broccoli.  Limit foods that are high in fat, such as fried foods and desserts.  Limit the amount of red meat and processed meat you eat, such as hot dogs, sausage, bacon, and lunch meats. General instructions  Keep all follow-up visits as told by your health care provider. This is important. ? This includes having regularly scheduled colonoscopies. ? Talk to your health care provider about when you need a colonoscopy. Contact a health care provider if:  You have new or worsening bleeding during a bowel movement.  You  have new or increased blood in your stool.  You have a change in bowel habits.  You lose weight for no known reason. Summary  Polyps are tissue growths inside the body. Polyps can grow in many places, including the colon.  Most colon polyps are noncancerous (benign), but some can become cancerous over time.  This condition is diagnosed with a colonoscopy.  Treatment for this condition involves removing any polyps that are found. Most polyps can be removed during a  colonoscopy. This information is not intended to replace advice given to you by your health care provider. Make sure you discuss any questions you have with your health care provider. Document Revised: 04/23/2017 Document Reviewed: 04/23/2017 Elsevier Patient Education  [place].  Colonoscopy Discharge Instructions  Read the instructions outlined below and refer to this sheet in the next few weeks. These discharge instructions provide you with general information on caring for yourself after you leave the hospital. Your doctor may also give you specific instructions. While your treatment has been planned according to the most current medical practices available, unavoidable complications occasionally occur.   ACTIVITY  You may resume your regular activity, but move at a slower pace for the next 24 hours.   Take frequent rest periods for the next 24 hours.   Walking will help get rid of the air and reduce the bloated feeling in your belly (abdomen).   No driving for 24 hours (because of the medicine (anesthesia) used during the test).    Do not sign any important legal documents or operate any machinery for 24 hours (because of the anesthesia used during the test).  NUTRITION  Drink plenty of fluids.   You may resume your normal diet as instructed by your doctor.   Begin with a light meal and progress to your normal diet. Heavy or fried foods are harder to digest and may make you feel sick to your stomach (nauseated).   Avoid alcoholic beverages for 24 hours or as instructed.  MEDICATIONS  You may resume your normal medications unless your doctor tells you otherwise.  WHAT YOU CAN EXPECT TODAY  Some feelings of bloating in the abdomen.   Passage of more gas than usual.   Spotting of blood in your stool or on the toilet paper.  IF YOU HAD POLYPS REMOVED DURING THE COLONOSCOPY:  No aspirin products for 7 days or as instructed.   No alcohol for 7 days or as  instructed.   Eat a soft diet for the next 24 hours.  FINDING OUT THE RESULTS OF YOUR TEST Not all test results are available during your visit. If your test results are not back during the visit, make an appointment with your caregiver to find out the results. Do not assume everything is normal if you have not heard from your caregiver or the medical facility. It is important for you to follow up on all of your test results.  SEEK IMMEDIATE MEDICAL ATTENTION IF:  You have more than a spotting of blood in your stool.   Your belly is swollen (abdominal distention).   You are nauseated or vomiting.   You have a temperature over 101.   You have abdominal pain or discomfort that is severe or gets worse throughout the day.   Your colonoscopy revealed 17 polyp(s) which I removed successfully. Await pathology results, my office will contact you. Follow up with Dr. Abbey Chatters in the next 2-3 weeks. We will discuss what to do next at  that time.    I hope you have a great rest of your week!  Elon Alas. Abbey Chatters, D.O. Gastroenterology and Hepatology [facility]

## 2019-11-14 NOTE — Transfer of Care (Signed)
Immediate Anesthesia Transfer of Care Note  Patient: Vanessa Rojas  Procedure(s) Performed: COLONOSCOPY WITH PROPOFOL (N/A ) POLYPECTOMY  Patient Location: Endoscopy Unit  Anesthesia Type:General  Level of Consciousness: awake and patient cooperative  Airway & Oxygen Therapy: Patient Spontanous Breathing  Post-op Assessment: Report given to RN and Post -op Vital signs reviewed and stable  Post vital signs: Reviewed and stable  Last Vitals:  Vitals Value Taken Time  BP    Temp    Pulse    Resp    SpO2      Last Pain:  Vitals:   11/14/19 1019  TempSrc:   PainSc: 0-No pain      Patients Stated Pain Goal: 5 (76/39/43 2003)  Complications: No complications documented.

## 2019-11-14 NOTE — Anesthesia Procedure Notes (Signed)
Date/Time: 11/14/2019 9:17 AM Performed by: Vista Deck, CRNA Pre-anesthesia Checklist: Patient identified, Emergency Drugs available, Suction available, Timeout performed and Patient being monitored Patient Re-evaluated:Patient Re-evaluated prior to induction Oxygen Delivery Method: Nasal Cannula

## 2019-11-14 NOTE — H&P (Signed)
Primary Care Physician:  Kathyrn Drown, MD Primary Gastroenterologist:  Dr. Abbey Chatters  Pre-Procedure History & Physical: HPI:  Vanessa Rojas is a 54 y.o. female is here for a colonoscopy for colon cancer screening purposes.  Patient denies any family history of colorectal cancer.  No melena or hematochezia.  No abdominal pain or unintentional weight loss.  No change in bowel habits.  Overall feels well from a GI standpoint.  Past Medical History:  Diagnosis Date  . Ankylosing spondylitis ([place]) 02/20/2019   Followed by rheumatologist Dr. Gavin Pound [facility] rheumatology, Julianne Rice, January 2021  . B12 deficiency 03/09/2019   B12 level 291 replenish with shots then oral medication February 2021  . Hypertension   . Hypokalemia 05/24/2019    Past Surgical History:  Procedure Laterality Date  . CESAREAN SECTION  july 2000  . ELBOW SURGERY Right child hood  . TONSILLECTOMY      Prior to Admission medications   Medication Sig Start Date End Date Taking? Authorizing Provider  albuterol (PROVENTIL) (2.5 MG/3ML) 0.083% nebulizer solution Take 3 mLs (2.5 mg total) by nebulization every 4 (four) hours as needed for wheezing. 11/08/19  Yes Luking, Elayne Snare, MD  benzonatate (TESSALON PERLES) 100 MG capsule Take 1 capsule (100 mg total) by mouth 3 (three) times daily as needed for cough. 11/08/19  Yes Kathyrn Drown, MD  cyanocobalamin 1000 MCG tablet Take 1,000 mcg by mouth daily.   Yes [provider]  HUMIRA PEN 40 MG/0.4ML PNKT Inject 40 mg into the skin every 14 (fourteen) days.  05/13/19  Yes [provider]  hydrochlorothiazide (HYDRODIURIL) 25 MG tablet Take 1 tablet (25 mg total) by mouth daily. 08/18/19  Yes Kathyrn Drown, MD  metoprolol tartrate (LOPRESSOR) 100 MG tablet Take one half tablet bid Patient taking differently: Take 50 mg by mouth in the morning and at bedtime.  07/05/19  Yes Luking, Elayne Snare, MD  pantoprazole (PROTONIX) 40 MG tablet Take 1 tablet (40 mg total)  by mouth daily. 08/18/19  Yes Kathyrn Drown, MD  potassium chloride (KLOR-CON) 10 MEQ tablet Take 2 tablets twice per day Patient taking differently: Take 10 mEq by mouth in the morning and at bedtime.  08/18/19  Yes Kathyrn Drown, MD  predniSONE (DELTASONE) 20 MG tablet 3qd for 3d then 2qd for 3d then 1qd for 3d 11/08/19  Yes Luking, Elayne Snare, MD  azithromycin (ZITHROMAX Z-PAK) 250 MG tablet Take 2 tablets (500 mg) on  Day 1,  followed by 1 tablet (250 mg) once daily on Days 2 through 5. 11/08/19   Luking, Elayne Snare, MD  SUPREP BOWEL PREP KIT 17.5-3.13-1.6 GM/177ML SOLN Take 354 mLs by mouth as directed. 10/19/19   [provider]    Allergies as of 10/26/2019 - Review Complete 10/19/2019  Allergen Reaction Noted  . Penicillins Other (See Comments) 06/08/2012  . Wellbutrin [bupropion] Nausea And Vomiting 12/05/2014    Family History  Problem Relation Age of Onset  . Cancer Mother        lung, liver, breast  . Cancer Father   . Hyperlipidemia Father   . Hypertension Father   . Cancer Brother        colon  . Diabetes Maternal Aunt   . Diabetes Maternal Grandmother     Social History   Socioeconomic History  . Marital status: Married    Spouse name: Not on file  . Number of children: Not on file  . Years of education: Not  on file  . Highest education level: Not on file  Occupational History  . Not on file  Tobacco Use  . Smoking status: Current Every Day Smoker    Packs/day: 0.75    Types: Cigarettes    Last attempt to quit: 11/15/2014    Years since quitting: 5.0  . Smokeless tobacco: Never Used  Substance and Sexual Activity  . Alcohol use: No    Alcohol/week: 0.0 standard drinks  . Drug use: No  . Sexual activity: Not on file  Other Topics Concern  . Not on file  Social History Narrative  . Not on file   Social Determinants of Health   Financial Resource Strain:   . Difficulty of Paying Living Expenses: Not on file  Food Insecurity:   . Worried About  Charity fundraiser in the Last Year: Not on file  . Ran Out of Food in the Last Year: Not on file  Transportation Needs:   . Lack of Transportation (Medical): Not on file  . Lack of Transportation (Non-Medical): Not on file  Physical Activity:   . Days of Exercise per Week: Not on file  . Minutes of Exercise per Session: Not on file  Stress:   . Feeling of Stress : Not on file  Social Connections:   . Frequency of Communication with Friends and Family: Not on file  . Frequency of Social Gatherings with Friends and Family: Not on file  . Attends Religious Services: Not on file  . Active Member of Clubs or Organizations: Not on file  . Attends Archivist Meetings: Not on file  . Marital Status: Not on file  Intimate Partner Violence:   . Fear of Current or Ex-Partner: Not on file  . Emotionally Abused: Not on file  . Physically Abused: Not on file  . Sexually Abused: Not on file    Review of Systems: See HPI, otherwise negative ROS  Impression/Plan: Vanessa Rojas is here for a colonoscopy to be performed for colon cancer screening purposes.  The risks of the procedure including infection, bleed, or perforation as well as benefits, limitations, alternatives and imponderables have been reviewed with the patient. Questions have been answered. All parties agreeable.

## 2019-11-15 LAB — SURGICAL PATHOLOGY

## 2019-11-17 ENCOUNTER — Encounter (HOSPITAL_COMMUNITY): Payer: Self-pay | Admitting: Internal Medicine

## 2019-11-17 ENCOUNTER — Encounter: Payer: Self-pay | Admitting: Family Medicine

## 2019-11-17 DIAGNOSIS — E876 Hypokalemia: Secondary | ICD-10-CM

## 2019-11-17 DIAGNOSIS — I10 Essential (primary) hypertension: Secondary | ICD-10-CM

## 2019-11-17 DIAGNOSIS — R5383 Other fatigue: Secondary | ICD-10-CM

## 2019-11-17 DIAGNOSIS — R252 Cramp and spasm: Secondary | ICD-10-CM

## 2019-11-17 MED ORDER — AMLODIPINE BESYLATE 2.5 MG PO TABS: 2.5000 mg | ORAL_TABLET | Freq: Every day | ORAL | 4 refills | Status: DC

## 2019-11-17 NOTE — Telephone Encounter (Signed)
Discussed with patient per Dr Nicki Reaper: Dr Nicki Reaper believes that the HCTZ is contributing to her low potassium and also causing her creatinine to go up. At this point in time he recommends for her to stop the HCTZ   Dr Nicki Reaper would recommend that she start amlodipine 2.5 mg, 1 daily for blood pressure, #30, 4 refills   She may continue the potassium Dr Nicki Reaper believes her numbers will come up with stopping the HCTZ IV potassium is only utilize when potassium is in the 2.9 or less range   Dr Nicki Reaper would recommend that Jacklynn recheck a metabolic 7 and magnesium, and TSH in approximately 10 days to 14 days Also she should do a follow-up with [place] after getting her labs drawn so approximately 2 to 3 weeks from now   Making these changes and the medicine should help her with the cramps.   Also Dr Nicki Reaper is glad that she had her colonoscopy because certainly this issue was important   Patient verbalized understanding. Prescription sent electronically to pharmacy. Blood work ordered in [employer]

## 2019-11-17 NOTE — Addendum Note (Signed)
Addended by: Dairl Ponder on: 11/17/2019 01:59 PM   Modules accepted: Orders

## 2019-11-17 NOTE — Telephone Encounter (Signed)
Nurses I believe that the HCTZ is contributing to her low potassium and also causing her creatinine to go up. At this point in time I will recommend for her to stop the HCTZ  I would recommend that she start amlodipine 2.5 mg, 1 daily for blood pressure, #30, 4 refills  She may continue the potassium I believe her numbers will come up with stopping the HCTZ IV potassium is only utilize when potassium is in the 2.9 or less range  I would recommend that Regnia recheck a metabolic 7 and magnesium, and TSH in approximately 10 days to 14 days Also she should do a follow-up with [place] after getting her labs drawn so approximately 2 to 3 weeks from now  Making these changes and the medicine should help her with the cramps.  Also I am glad that she had her colonoscopy because certainly this issue was important  Thanks-Dr. Nicki Reaper

## 2019-11-23 ENCOUNTER — Encounter: Payer: Self-pay | Admitting: *Deleted

## 2019-12-06 ENCOUNTER — Encounter (HOSPITAL_COMMUNITY): Payer: Self-pay

## 2019-12-09 ENCOUNTER — Other Ambulatory Visit: Payer: Self-pay

## 2019-12-09 ENCOUNTER — Other Ambulatory Visit (HOSPITAL_COMMUNITY)
Admission: RE | Admit: 2019-12-09 | Discharge: 2019-12-09 | Disposition: A | Discharge status: Home / Self Care | Payer: PRIVATE HEALTH INSURANCE | Source: Ambulatory Visit | Attending: Internal Medicine | Admitting: Internal Medicine

## 2019-12-09 DIAGNOSIS — Z01812 Encounter for preprocedural laboratory examination: Secondary | ICD-10-CM | POA: Diagnosis present

## 2019-12-09 DIAGNOSIS — Z20822 Contact with and (suspected) exposure to covid-19: Secondary | ICD-10-CM | POA: Diagnosis not present

## 2019-12-09 LAB — SARS CORONAVIRUS 2 (TAT 6-24 HRS): SARS Coronavirus 2: NEGATIVE

## 2019-12-12 ENCOUNTER — Ambulatory Visit (HOSPITAL_COMMUNITY): Payer: PRIVATE HEALTH INSURANCE | Admitting: Anesthesiology

## 2019-12-12 ENCOUNTER — Other Ambulatory Visit: Payer: Self-pay | Admitting: Family Medicine

## 2019-12-12 ENCOUNTER — Ambulatory Visit (HOSPITAL_COMMUNITY)
Admission: RE | Admit: 2019-12-12 | Discharge: 2019-12-12 | Disposition: A | Discharge status: Home / Self Care | Payer: PRIVATE HEALTH INSURANCE | Attending: Internal Medicine | Admitting: Internal Medicine

## 2019-12-12 ENCOUNTER — Encounter (HOSPITAL_COMMUNITY)
Admission: RE | Disposition: A | Discharge status: Home / Self Care | Payer: Self-pay | Source: Home / Self Care | Attending: Internal Medicine

## 2019-12-12 ENCOUNTER — Encounter (HOSPITAL_COMMUNITY): Payer: Self-pay

## 2019-12-12 ENCOUNTER — Other Ambulatory Visit: Payer: Self-pay

## 2019-12-12 DIAGNOSIS — Z800 Family history of malignant neoplasm of digestive organs: Secondary | ICD-10-CM | POA: Diagnosis not present

## 2019-12-12 DIAGNOSIS — K2980 Duodenitis without bleeding: Secondary | ICD-10-CM | POA: Diagnosis not present

## 2019-12-12 DIAGNOSIS — R140 Abdominal distension (gaseous): Secondary | ICD-10-CM | POA: Diagnosis not present

## 2019-12-12 DIAGNOSIS — K2970 Gastritis, unspecified, without bleeding: Secondary | ICD-10-CM | POA: Diagnosis not present

## 2019-12-12 DIAGNOSIS — Z880 Allergy status to penicillin: Secondary | ICD-10-CM | POA: Insufficient documentation

## 2019-12-12 DIAGNOSIS — Z888 Allergy status to other drugs, medicaments and biological substances status: Secondary | ICD-10-CM | POA: Insufficient documentation

## 2019-12-12 DIAGNOSIS — F17210 Nicotine dependence, cigarettes, uncomplicated: Secondary | ICD-10-CM | POA: Insufficient documentation

## 2019-12-12 DIAGNOSIS — R1013 Epigastric pain: Secondary | ICD-10-CM | POA: Insufficient documentation

## 2019-12-12 DIAGNOSIS — Z79899 Other long term (current) drug therapy: Secondary | ICD-10-CM | POA: Insufficient documentation

## 2019-12-12 DIAGNOSIS — K319 Disease of stomach and duodenum, unspecified: Secondary | ICD-10-CM | POA: Insufficient documentation

## 2019-12-12 HISTORY — PX: ESOPHAGOGASTRODUODENOSCOPY (EGD) WITH PROPOFOL: SHX5813

## 2019-12-12 HISTORY — PX: BIOPSY: SHX5522

## 2019-12-12 SURGERY — ESOPHAGOGASTRODUODENOSCOPY (EGD) WITH PROPOFOL
Anesthesia: General

## 2019-12-12 MED ORDER — LACTATED RINGERS IV SOLN: Freq: Once | INTRAVENOUS | Status: AC

## 2019-12-12 MED ORDER — PROPOFOL 10 MG/ML IV BOLUS
INTRAVENOUS | Status: DC | PRN
  Administered 2019-12-12: 100 mg via INTRAVENOUS
  Administered 2019-12-12: 150 ug/kg/min via INTRAVENOUS

## 2019-12-12 MED ORDER — GLYCOPYRROLATE 0.2 MG/ML IJ SOLN
0.2000 mg | Freq: Once | INTRAMUSCULAR | Status: AC
  Administered 2019-12-12: 0.2 mg via INTRAVENOUS

## 2019-12-12 MED ORDER — LACTATED RINGERS IV SOLN: INTRAVENOUS | Status: DC | PRN

## 2019-12-12 MED ORDER — LIDOCAINE VISCOUS HCL 2 % MT SOLN
OROMUCOSAL | Status: AC
  Administered 2019-12-12: 15 mL via OROMUCOSAL
  Filled 2019-12-12: qty 15

## 2019-12-12 MED ORDER — GLYCOPYRROLATE 0.2 MG/ML IJ SOLN
INTRAMUSCULAR | Status: AC
  Filled 2019-12-12: qty 1

## 2019-12-12 MED ORDER — PANTOPRAZOLE SODIUM 40 MG PO TBEC: 40.0000 mg | DELAYED_RELEASE_TABLET | Freq: Two times a day (BID) | ORAL | 5 refills | Status: DC

## 2019-12-12 MED ORDER — LIDOCAINE VISCOUS HCL 2 % MT SOLN: 15.0000 mL | Freq: Once | OROMUCOSAL | Status: AC

## 2019-12-12 NOTE — Discharge Instructions (Addendum)
EGD Discharge instructions Please read the instructions outlined below and refer to this sheet in the next few weeks. These discharge instructions provide you with general information on caring for yourself after you leave the hospital. Your doctor may also give you specific instructions. While your treatment has been planned according to the most current medical practices available, unavoidable complications occasionally occur. If you have any problems or questions after discharge, please call your doctor. ACTIVITY  You may resume your regular activity but move at a slower pace for the next 24 hours.   Take frequent rest periods for the next 24 hours.   Walking will help expel (get rid of) the air and reduce the bloated feeling in your abdomen.   No driving for 24 hours (because of the anesthesia (medicine) used during the test).   You may shower.   Do not sign any important legal documents or operate any machinery for 24 hours (because of the anesthesia used during the test).  NUTRITION  Drink plenty of fluids.   You may resume your normal diet.   Begin with a light meal and progress to your normal diet.   Avoid alcoholic beverages for 24 hours or as instructed by your caregiver.  MEDICATIONS  You may resume your normal medications unless your caregiver tells you otherwise.  WHAT YOU CAN EXPECT TODAY  You may experience abdominal discomfort such as a feeling of fullness or "gas" pains.  FOLLOW-UP  Your doctor will discuss the results of your test with you.  SEEK IMMEDIATE MEDICAL ATTENTION IF ANY OF THE FOLLOWING OCCUR:  Excessive nausea (feeling sick to your stomach) and/or vomiting.   Severe abdominal pain and distention (swelling).   Trouble swallowing.   Temperature over 101 F (37.8 C).   Rectal bleeding or vomiting of blood.    Your EGD showed inflammation both in your stomach and small bowel.  I biopsied both to rule out infection as well as celiac disease.  I  am going to increase your pantoprazole to 40 mg twice daily for the next 8 weeks, new prescription sent to your pharmacy.  You can decrease back to once daily thereafter.  Avoid NSAIDs especially can.  Await pathology results, my office will contact you.  Follow-up with GI in 2 to 3 months.  I hope you have a great rest of your week!  Elon Alas. Abbey Chatters, D.O. Gastroenterology and Hepatology [facility]  PATIENT INSTRUCTIONS POST-ANESTHESIA  IMMEDIATELY FOLLOWING SURGERY:  Do not drive or operate machinery for the first twenty four hours after surgery.  Do not make any important decisions for twenty four hours after surgery or while taking narcotic pain medications or sedatives.  If you develop intractable nausea and vomiting or a severe headache please notify your doctor immediately.  FOLLOW-UP:  Please make an appointment with your surgeon as instructed. You do not need to follow up with anesthesia unless specifically instructed to do so.  WOUND CARE INSTRUCTIONS (if applicable):  Keep a dry clean dressing on the anesthesia/puncture wound site if there is drainage.  Once the wound has quit draining you may leave it open to air.  Generally you should leave the bandage intact for twenty four hours unless there is drainage.  If the epidural site drains for more than 36-48 hours please call the anesthesia department.  QUESTIONS?:  Please feel free to call your physician or the hospital operator if you have any questions, and they will be happy to assist you.

## 2019-12-12 NOTE — Anesthesia Preprocedure Evaluation (Addendum)
Anesthesia Evaluation  Patient identified by MRN, date of birth, ID band Patient awake    Reviewed: Allergy & Precautions, NPO status , Patient's Chart, lab work & pertinent test results, reviewed documented beta blocker date and time   History of Anesthesia Complications Negative for: history of anesthetic complications  Airway Mallampati: III  TM Distance: >3 FB Neck ROM: Full    Dental  (+) Dental Advisory [place] :   Pulmonary asthma , Current SmokerPatient did not abstain from smoking.,    Pulmonary exam normal breath sounds clear to auscultation       Cardiovascular Exercise Tolerance: Good hypertension, Pt. on medications and Pt. on home beta blockers Normal cardiovascular exam Rhythm:Regular Rate:Normal     Neuro/Psych TIAnegative psych ROS   GI/Hepatic Neg liver ROS, GERD  Medicated and Controlled,  Endo/Other  negative endocrine ROS  Renal/GU negative Renal ROS     Musculoskeletal  (+) Arthritis  (ankylosing spondylitis ),   Abdominal   Peds  Hematology negative hematology ROS (+)   Anesthesia Other Findings   Reproductive/Obstetrics negative OB ROS                            Anesthesia Physical  Anesthesia Plan  ASA: III  Anesthesia Plan: General   Post-op Pain Management:    Induction: Intravenous  PONV Risk Score and Plan: TIVA  Airway Management Planned: Nasal Cannula, Natural Airway and Simple Face Mask  Additional Equipment:   Intra-op Plan:   Post-operative Plan:   Informed Consent: I have reviewed the patients History and Physical, chart, labs and discussed the procedure including the risks, benefits and alternatives for the proposed anesthesia with the patient or authorized representative who has indicated his/her understanding and acceptance.     Dental advisory given  Plan Discussed with: CRNA and Surgeon  Anesthesia Plan Comments:          Anesthesia Quick Evaluation

## 2019-12-12 NOTE — Transfer of Care (Signed)
Immediate Anesthesia Transfer of Care Note  Patient: Vanessa Rojas  Procedure(s) Performed: ESOPHAGOGASTRODUODENOSCOPY (EGD) WITH PROPOFOL (N/A ) BIOPSY  Patient Location: Endoscopy Unit  Anesthesia Type:General  Level of Consciousness: awake, alert , oriented and patient cooperative  Airway & Oxygen Therapy: Patient Spontanous Breathing  Post-op Assessment: Report given to RN, Post -op Vital signs reviewed and stable and Patient moving all extremities  Post vital signs: Reviewed and stable  Last Vitals:  Vitals Value Taken Time  BP    Temp    Pulse    Resp    SpO2      Last Pain:  Vitals:   12/12/19 0947  TempSrc:   PainSc: 0-No pain      Patients Stated Pain Goal: 6 (81/38/87 1959)  Complications: No complications documented.

## 2019-12-12 NOTE — Anesthesia Postprocedure Evaluation (Signed)
Anesthesia Post Note  Patient: Vanessa Rojas  Procedure(s) Performed: ESOPHAGOGASTRODUODENOSCOPY (EGD) WITH PROPOFOL (N/A ) BIOPSY  Patient location during evaluation: Endoscopy Anesthesia Type: General Level of consciousness: awake, oriented, awake and alert and patient cooperative Pain management: pain level controlled Vital Signs Assessment: post-procedure vital signs reviewed and stable Respiratory status: spontaneous breathing, respiratory function stable and nonlabored ventilation Cardiovascular status: blood pressure returned to baseline and stable Postop Assessment: no headache and no backache Anesthetic complications: no   No complications documented.   Last Vitals:  Vitals:   12/12/19 0839  BP: (!) 138/99  Pulse: 60  Resp: 17  Temp: 36.5 C  SpO2: 97%    Last Pain:  Vitals:   12/12/19 0947  TempSrc:   PainSc: 0-No pain                 Tacy Learn

## 2019-12-12 NOTE — Op Note (Signed)
[facility] Patient Name: Vanessa Rojas Procedure Date: 12/12/2019 9:33 AM MRN: [id number] Date of Birth: 1965/08/04 Attending MD: Elon Alas. Edgar Frisk CSN: [id number] Age: 54 Admit Type: Outpatient Procedure:                Upper GI endoscopy Indications:              Epigastric abdominal pain, Abdominal bloating Providers:                Elon Alas. Hudsyn Champine, DO, Otis Peak B. Sharon Seller, RN,                            Nelma Rothman, Technician Referring MD:              Medicines:                See the Anesthesia note for documentation of the                            administered medications Complications:            No immediate complications. Estimated Blood Loss:     Estimated blood loss was minimal. Procedure:                Pre-Anesthesia Assessment:                           - The anesthesia plan was to use monitored                            anesthesia care (MAC).                           After obtaining informed consent, the endoscope was                            passed under direct vision. Throughout the                            procedure, the patient's blood pressure, pulse, and                            oxygen saturations were monitored continuously. The                            [id number]) was introduced through the mouth,                            and advanced to the second part of duodenum. The                            upper GI endoscopy was accomplished without                            difficulty. The patient tolerated the procedure                            well. Scope In:  9:48:47 AM Scope Out: 9:53:28 AM Total Procedure Duration: 0 hours 4 minutes 41 seconds  Findings:      There is no endoscopic evidence of Barrett's esophagus, esophagitis,       inflammation, ulcerations or varices in the entire esophagus.      Localized moderate inflammation characterized by erosions and erythema       was found in the gastric antrum. Biopsies were taken  with a cold forceps       for Helicobacter pylori testing.      Localized mild inflammation characterized by erosions and erythema was       found in the duodenal bulb. Biopsies were taken with a cold forceps for       histology. Impression:               - Gastritis. Biopsied.                           - Duodenitis. Biopsied. Moderate Sedation:      Per Anesthesia Care Recommendation:           - Patient has a contact number available for                            emergencies. The signs and symptoms of potential                            delayed complications were discussed with the                            patient. Return to normal activities tomorrow.                            Written discharge instructions were provided to the                            patient.                           - Resume previous diet.                           - Continue present medications.                           - Await pathology results.                           - Return to GI clinic in 3 months.                           - Use Protonix (pantoprazole) 40 mg PO BID for 8                            weeks then decrease toonce daily therafter. Procedure Code(s):        --- Professional ---                           [phone], Esophagogastroduodenoscopy, flexible,  transoral; with biopsy, single or multiple Diagnosis Code(s):        --- Professional ---                           K29.70, Gastritis, unspecified, without bleeding                           K29.80, Duodenitis without bleeding                           R10.13, Epigastric pain                           R14.0, Abdominal distension (gaseous) CPT copyright 2019 American Medical Association. All rights reserved. The codes documented in this report are preliminary and upon coder review may  be revised to meet current compliance requirements. Elon Alas. Abbey Chatters, DO [place] Abbey Chatters, DO 12/12/2019 9:56:51 AM This report  has been signed electronically. Number of Addenda: 0

## 2019-12-12 NOTE — H&P (Signed)
Primary Care Physician:  Kathyrn Drown, MD Primary Gastroenterologist:  Dr. Abbey Chatters  Pre-Procedure History & Physical: HPI:  Vanessa Rojas is a 54 y.o. female is here for an EGD due to history of epigastric pain and burning.  Patient denies any family history of colorectal cancer.  No melena or hematochezia.  No  unintentional weight loss.  No change in bowel habits.    Past Medical History:  Diagnosis Date  . Ankylosing spondylitis ([place]) 02/20/2019   Followed by rheumatologist Dr. Gavin Pound [facility] rheumatology, Julianne Rice, January 2021  . B12 deficiency 03/09/2019   B12 level 291 replenish with shots then oral medication February 2021  . Hypertension   . Hypokalemia 05/24/2019    Past Surgical History:  Procedure Laterality Date  . CESAREAN SECTION  july 2000  . COLONOSCOPY WITH PROPOFOL N/A 11/14/2019   Procedure: COLONOSCOPY WITH PROPOFOL;  Surgeon: Eloise Harman, DO;  Location: [facility]O [facility];  Service: Endoscopy;  Laterality: N/A;  10:30  . ELBOW SURGERY Right child hood  . POLYPECTOMY  11/14/2019   Procedure: POLYPECTOMY;  Surgeon: Eloise Harman, DO;  Location: [facility] ENDO SUITE;  Service: Endoscopy;;  . TONSILLECTOMY      Prior to Admission medications   Medication Sig Start Date End Date Taking? Authorizing Provider  albuterol (PROVENTIL) (2.5 MG/3ML) 0.083% nebulizer solution Take 3 mLs (2.5 mg total) by nebulization every 4 (four) hours as needed for wheezing. Patient taking differently: Take 2.5 mg by nebulization every 4 (four) hours as needed for wheezing (Cough).  11/08/19  Yes Luking, Elayne Snare, MD  amLODipine (NORVASC) 2.5 MG tablet Take 1 tablet (2.5 mg total) by mouth daily. 11/17/19  Yes Kathyrn Drown, MD  cyanocobalamin 1000 MCG tablet Take 1,000 mcg by mouth daily.   Yes [provider]  HUMIRA PEN 40 MG/0.4ML PNKT Inject 40 mg into the skin every 14 (fourteen) days.  05/13/19  Yes [provider]  metoprolol tartrate (LOPRESSOR) 100 MG  tablet Take one half tablet bid Patient taking differently: Take 50 mg by mouth in the morning and at bedtime.  07/05/19  Yes Luking, Elayne Snare, MD  pantoprazole (PROTONIX) 40 MG tablet Take 1 tablet (40 mg total) by mouth daily. 08/18/19  Yes Kathyrn Drown, MD  potassium chloride (KLOR-CON) 10 MEQ tablet Take 2 tablets twice per day Patient taking differently: Take 20 mEq by mouth in the morning and at bedtime.  08/18/19  Yes Kathyrn Drown, MD    Allergies as of 11/23/2019 - Review Complete 11/14/2019  Allergen Reaction Noted  . Penicillins Other (See Comments) 06/08/2012  . Wellbutrin [bupropion] Nausea And Vomiting 12/05/2014    Family History  Problem Relation Age of Onset  . Cancer Mother        lung, liver, breast  . Cancer Father   . Hyperlipidemia Father   . Hypertension Father   . Cancer Brother        colon  . Diabetes Maternal Aunt   . Diabetes Maternal Grandmother     Social History   Socioeconomic History  . Marital status: Married    Spouse name: Not on file  . Number of children: Not on file  . Years of education: Not on file  . Highest education level: Not on file  Occupational History  . Not on file  Tobacco Use  . Smoking status: Current Every Day Smoker    Packs/day: 0.75    Types: Cigarettes    Last attempt  to quit: 11/15/2014    Years since quitting: 5.0  . Smokeless tobacco: Never Used  Vaping Use  . Vaping Use: Never used  Substance and Sexual Activity  . Alcohol use: No    Alcohol/week: 0.0 standard drinks  . Drug use: No  . Sexual activity: Not on file  Other Topics Concern  . Not on file  Social History Narrative  . Not on file   Social Determinants of Health   Financial Resource Strain:   . Difficulty of Paying Living Expenses: Not on file  Food Insecurity:   . Worried About Charity fundraiser in the Last Year: Not on file  . Ran Out of Food in the Last Year: Not on file  Transportation Needs:   . Lack of Transportation  (Medical): Not on file  . Lack of Transportation (Non-Medical): Not on file  Physical Activity:   . Days of Exercise per Week: Not on file  . Minutes of Exercise per Session: Not on file  Stress:   . Feeling of Stress : Not on file  Social Connections:   . Frequency of Communication with Friends and Family: Not on file  . Frequency of Social Gatherings with Friends and Family: Not on file  . Attends Religious Services: Not on file  . Active Member of Clubs or Organizations: Not on file  . Attends Archivist Meetings: Not on file  . Marital Status: Not on file  Intimate Partner Violence:   . Fear of Current or Ex-Partner: Not on file  . Emotionally Abused: Not on file  . Physically Abused: Not on file  . Sexually Abused: Not on file    Review of Systems: See HPI, otherwise negative ROS  Impression/Plan: Vanessa Rojas is here for a EGD due to epigastric pain and burning  The risks of the procedure including infection, bleed, or perforation as well as benefits, limitations, alternatives and imponderables have been reviewed with the patient. Questions have been answered. All parties agreeable.

## 2019-12-13 ENCOUNTER — Other Ambulatory Visit: Payer: Self-pay

## 2019-12-14 LAB — SURGICAL PATHOLOGY

## 2019-12-19 ENCOUNTER — Encounter (HOSPITAL_COMMUNITY): Payer: Self-pay | Admitting: Internal Medicine

## 2019-12-21 ENCOUNTER — Ambulatory Visit: Payer: PRIVATE HEALTH INSURANCE | Admitting: Internal Medicine

## 2020-01-06 ENCOUNTER — Other Ambulatory Visit: Payer: Self-pay | Admitting: Family Medicine

## 2020-02-09 ENCOUNTER — Other Ambulatory Visit: Payer: Self-pay | Admitting: Family Medicine

## 2020-02-20 ENCOUNTER — Other Ambulatory Visit: Payer: Self-pay

## 2020-02-20 ENCOUNTER — Telehealth: Payer: Self-pay | Admitting: Family Medicine

## 2020-02-20 ENCOUNTER — Telehealth (INDEPENDENT_AMBULATORY_CARE_PROVIDER_SITE_OTHER): Payer: Self-pay | Admitting: Family Medicine

## 2020-02-20 DIAGNOSIS — I10 Essential (primary) hypertension: Secondary | ICD-10-CM

## 2020-02-20 DIAGNOSIS — E876 Hypokalemia: Secondary | ICD-10-CM

## 2020-02-20 DIAGNOSIS — R252 Cramp and spasm: Secondary | ICD-10-CM

## 2020-02-20 MED ORDER — AMLODIPINE BESYLATE 2.5 MG PO TABS
2.5000 mg | ORAL_TABLET | Freq: Every day | ORAL | 6 refills | Status: DC
Start: 2020-02-20 — End: 2020-09-03

## 2020-02-20 MED ORDER — METOPROLOL TARTRATE 100 MG PO TABS
ORAL_TABLET | ORAL | 5 refills | Status: DC
Start: 2020-02-20 — End: 2020-08-06

## 2020-02-20 NOTE — Progress Notes (Signed)
   Subjective:    Patient ID: Vanessa Rojas, female    DOB: 1965/08/18, 55 y.o.   MRN: [id number]  Hypertension This is a chronic problem. There are no compliance problems.    Primary hypertension - Plan: Lipid Profile, Comprehensive Metabolic Panel (CMET), Magnesium, TSH, T4  Hypokalemia - Plan: Lipid Profile, Comprehensive Metabolic Panel (CMET), Magnesium, TSH, T4  Muscle cramp She relates a lot of problems with muscle pain neck pain not feeling good since stopping Humira she wonders what she could take short-term she could use ibuprofen but long-term Tylenol hopefully she will get her Humira soon  Reports blood pressure doing all right taking her medicine watches her diet recently change away from diuretic to avoid low potassium still taking potassium currently will need to check labs  Muscle cramps are worse in the evening and at nighttime causes her a lot of distress  Patient also with some potassium issues as well  Pt has had issues with getting Humira shot due to insurance so she has had neck and shoulder pain.  Virtual Visit via Telephone Note  I connected with Vanessa Rojas on 02/20/20 at  3:00 PM EST by telephone and verified that I am speaking with the correct person using two identifiers.  Location: Patient: home Provider: office   I discussed the limitations, risks, security and privacy concerns of performing an evaluation and management service by telephone and the availability of in person appointments. I also discussed with the patient that there may be a patient responsible charge related to this service. The patient expressed understanding and agreed to proceed.   History of Present Illness:    Observations/Objective:   Assessment and Plan:   Follow Up Instructions:    I discussed the assessment and treatment plan with the patient. The patient was provided an opportunity to ask questions and all were answered. The patient agreed with the plan and  demonstrated an understanding of the instructions.   The patient was advised to call back or seek an in-person evaluation if the symptoms worsen or if the condition fails to improve as anticipated.  I provided 20 including chart review time spent with patient documentation minutes of non-face-to-face time during this encounter.        Review of Systems     Objective:   Physical Exam        Assessment & Plan:  1. Primary hypertension Blood pressure reportedly good continue current medications watch diet stay active - Lipid Profile - Comprehensive Metabolic Panel (CMET) - Magnesium - TSH - T4  2. Hypokalemia Check potassium because of history currently taking potassium recently we change medications to amlodipine to get away from diuretic - Lipid Profile - Comprehensive Metabolic Panel (CMET) - Magnesium - TSH - T4  3. Muscle cramp Gentle stretching recommended follow-up if progressive troubles recommend checking lab work.  May need to consider supplements or other medications if these problems persist I recommend for the patient to consider a B12 supplement   If all goes well follow-up in approximately 4 to 6 months

## 2020-02-20 NOTE — Telephone Encounter (Signed)
Ms. lotus, santillo are scheduled for a virtual visit with your provider today.    Just as we do with appointments in the office, we must obtain your consent to participate.  Your consent will be active for this visit and any virtual visit you may have with one of our providers in the next 365 days.    If you have a MyChart account, I can also send a copy of this consent to you electronically.  All virtual visits are billed to your insurance company just like a traditional visit in the office.  As this is a virtual visit, video technology does not allow for your provider to perform a traditional examination.  This may limit your provider's ability to fully assess your condition.  If your provider identifies any concerns that need to be evaluated in person or the need to arrange testing such as labs, EKG, etc, we will make arrangements to do so.    Although advances in technology are sophisticated, we cannot ensure that it will always work on either your end or our end.  If the connection with a video visit is poor, we may have to switch to a telephone visit.  With either a video or telephone visit, we are not always able to ensure that we have a secure connection.   I need to obtain your verbal consent now.   Are you willing to proceed with your visit today?   LEONTINA SKIDMORE has provided verbal consent on 02/20/2020 for a virtual visit (video or telephone).   Vicente Males, LPN 09/13/2351  6:14 PM

## 2020-02-22 ENCOUNTER — Other Ambulatory Visit: Payer: Self-pay

## 2020-02-22 ENCOUNTER — Encounter: Payer: Self-pay | Admitting: Internal Medicine

## 2020-02-22 ENCOUNTER — Ambulatory Visit: Payer: 59 | Admitting: Internal Medicine

## 2020-02-22 VITALS — BP 141/80 | HR 55 | Temp 97.0°F | Ht 60.0 in | Wt 156.0 lb

## 2020-02-22 DIAGNOSIS — D126 Benign neoplasm of colon, unspecified: Secondary | ICD-10-CM | POA: Insufficient documentation

## 2020-02-22 DIAGNOSIS — K219 Gastro-esophageal reflux disease without esophagitis: Secondary | ICD-10-CM

## 2020-02-22 DIAGNOSIS — D125 Benign neoplasm of sigmoid colon: Secondary | ICD-10-CM | POA: Diagnosis not present

## 2020-02-22 MED ORDER — PANTOPRAZOLE SODIUM 40 MG PO TBEC: 40.0000 mg | DELAYED_RELEASE_TABLET | Freq: Two times a day (BID) | ORAL | 3 refills | Status: DC

## 2020-02-22 NOTE — Patient Instructions (Signed)
Continue on Protonix twice daily for your indigestion.  I sent a year supply refills to your pharmacy.  We will plan on repeat colonoscopy in October for surveillance purposes given the numerous polyps.  Follow-up with GI as needed.  Lifestyle and home remedies TO MANAGE REFLUX/HEARTBURN    You may eliminate or reduce the frequency of heartburn by making the following lifestyle changes:    Control your weight. Being overweight is a major risk factor for heartburn and GERD. Excess pounds put pressure on your abdomen, pushing up your stomach and causing acid to back up into your esophagus.     Eat smaller meals. 4 TO 6 MEALS A DAY. This reduces pressure on the lower esophageal sphincter, helping to prevent the valve from opening and acid from washing back into your esophagus.      Loosen your belt. Clothes that fit tightly around your waist put pressure on your abdomen and the lower esophageal sphincter.      Eliminate heartburn triggers. Everyone has specific triggers. Common triggers such as fatty or fried foods, spicy food, tomato sauce, carbonated beverages, alcohol, chocolate, mint, garlic, onion, caffeine and nicotine may make heartburn worse.     Avoid stooping or bending. Tying your shoes is OK. Bending over for longer periods to weed your garden isn't, especially soon after eating.     Don't lie down after a meal. Wait at least three to four hours after eating before going to bed, and don't lie down right after eating.   At [facility] we value your feedback. You may receive a survey about your visit today. Please share your experience as we strive to create trusting relationships with our patients to provide genuine, compassionate, quality care.  We appreciate your understanding and patience as we review any laboratory studies, imaging, and other diagnostic tests that are ordered as we care for you. Our office policy is 5 business days for review of these results,  and any emergent or urgent results are addressed in a timely manner for your best interest. If you do not hear from our office in 1 week, please contact us.   We also encourage the use of MyChart, which contains your medical information for your review as well. If you are not enrolled in this feature, an access code is on this after visit summary for your convenience. Thank you for allowing [place] to be involved in your care.  It was great to see you today!  I hope you have a great rest of your winter!!    Elon Alas. Abbey Chatters, D.O. Gastroenterology and Hepatology [facility]

## 2020-02-22 NOTE — Progress Notes (Signed)
Referring Provider: Kathyrn Drown, MD Primary Care Physician:  Kathyrn Drown, MD Primary GI:  Dr. Abbey Chatters  Chief Complaint  Patient presents with  . Follow-up    Doing well     HPI:   Vanessa Rojas is a 55 y.o. female who presents to the clinic today for follow-up visit.  She has a history of numerous colon polyps (greater than 10) on last colonoscopy October 2021 many of which were adenomatous.  She has a planned colonoscopy recall in October 2022.  Also has chronic reflux and indigestion.  EGD showed moderate inflammation in the stomach and duodenum, biopsies negative for H. pylori.  Since her EGD she has been Protonix twice daily she states her symptoms are vastly improved.  However as she forgets to take her nightly dose she will have breakthrough symptoms.  No dysphagia odynophagia  Past Medical History:  Diagnosis Date  . Ankylosing spondylitis ([place]) 02/20/2019   Followed by rheumatologist Dr. Gavin Pound [facility] rheumatology, Julianne Rice, January 2021  . B12 deficiency 03/09/2019   B12 level 291 replenish with shots then oral medication February 2021  . Hypertension   . Hypokalemia 05/24/2019    Past Surgical History:  Procedure Laterality Date  . BIOPSY  12/12/2019   Procedure: BIOPSY;  Surgeon: Eloise Harman, DO;  [facility];  Service: Endoscopy;;  . CESAREAN SECTION  july 2000  . COLONOSCOPY WITH PROPOFOL N/A 11/14/2019   Procedure: COLONOSCOPY WITH PROPOFOL;  Surgeon: Eloise Harman, DO;  [facility];  Service: Endoscopy;  Laterality: N/A;  10:30  . ELBOW SURGERY Right child hood  . ESOPHAGOGASTRODUODENOSCOPY (EGD) WITH PROPOFOL N/A 12/12/2019   Procedure: ESOPHAGOGASTRODUODENOSCOPY (EGD) WITH PROPOFOL;  Surgeon: Eloise Harman, DO;  [facility];  Service: Endoscopy;  Laterality: N/A;  9:45am  . POLYPECTOMY  11/14/2019   Procedure: POLYPECTOMY;  Surgeon: Eloise Harman, DO;  [facility];  Service:  Endoscopy;;  . TONSILLECTOMY      Current Outpatient Medications  Medication Sig Dispense Refill  . albuterol (PROVENTIL) (2.5 MG/3ML) 0.083% nebulizer solution Take 3 mLs (2.5 mg total) by nebulization every 4 (four) hours as needed for wheezing. (Patient taking differently: Take 2.5 mg by nebulization every 4 (four) hours as needed for wheezing (Cough).) 75 mL 12  . amLODipine (NORVASC) 2.5 MG tablet Take 1 tablet (2.5 mg total) by mouth daily. 30 tablet 6  . cyanocobalamin 1000 MCG tablet Take 1,000 mcg by mouth daily.    Marland Kitchen HUMIRA PEN 40 MG/0.4ML PNKT Inject 40 mg into the skin every 14 (fourteen) days.     . metoprolol tartrate (LOPRESSOR) 100 MG tablet TAKE (1/2) TABLET BY MOUTH TWICE DAILY. 30 tablet 5  . pantoprazole (PROTONIX) 40 MG tablet Take 1 tablet (40 mg total) by mouth 2 (two) times daily before a meal. 60 tablet 5  . potassium chloride (KLOR-CON) 10 MEQ tablet TAKE (2) TABLETS BY MOUTH TWICE DAILY. 120 tablet 0   No current facility-administered medications for this visit.    Allergies as of 02/22/2020 - Review Complete 02/22/2020  Allergen Reaction Noted  . Penicillins Other (See Comments) 06/08/2012  . Wellbutrin [bupropion] Nausea And Vomiting 12/05/2014    Family History  Problem Relation Age of Onset  . Cancer Mother        lung, liver, breast  . Cancer Father   . Hyperlipidemia Father   . Hypertension Father   . Cancer Brother  colon  . Diabetes Maternal Aunt   . Diabetes Maternal Grandmother     Social History   Socioeconomic History  . Marital status: Married    Spouse name: Not on file  . Number of children: Not on file  . Years of education: Not on file  . Highest education level: Not on file  Occupational History  . Not on file  Tobacco Use  . Smoking status: Current Every Day Smoker    Packs/day: 0.75    Types: Cigarettes    Last attempt to quit: 11/15/2014    Years since quitting: 5.2  . Smokeless tobacco: Never Used  Vaping Use   . Vaping Use: Never used  Substance and Sexual Activity  . Alcohol use: No    Alcohol/week: 0.0 standard drinks  . Drug use: No  . Sexual activity: Not on file  Other Topics Concern  . Not on file  Social History Narrative  . Not on file   Social Determinants of Health   Financial Resource Strain: Not on file  Food Insecurity: Not on file  Transportation Needs: Not on file  Physical Activity: Not on file  Stress: Not on file  Social Connections: Not on file    Subjective: Review of Systems  Constitutional: Negative for chills and fever.  HENT: Negative for congestion and hearing loss.   Eyes: Negative for blurred vision and double vision.  Respiratory: Negative for cough and shortness of breath.   Cardiovascular: Negative for chest pain and palpitations.  Gastrointestinal: Negative for abdominal pain, blood in stool, constipation, diarrhea, heartburn, melena and vomiting.  Genitourinary: Negative for dysuria and urgency.  Musculoskeletal: Negative for joint pain and myalgias.  Skin: Negative for itching and rash.  Neurological: Negative for dizziness and headaches.  Psychiatric/Behavioral: Negative for depression. The patient is not nervous/anxious.      Objective: BP (!) 141/80   Pulse (!) 55   Temp (!) 97 F (36.1 C) (Temporal)   Ht 5' (1.524 m)   Wt 156 lb (70.8 kg)   LMP 05/09/2012   BMI 30.47 kg/m  Physical Exam Constitutional:      Appearance: Normal appearance.  HENT:     Head: Normocephalic and atraumatic.  Eyes:     Extraocular Movements: Extraocular movements intact.     Conjunctiva/sclera: Conjunctivae normal.  Cardiovascular:     Rate and Rhythm: Normal rate and regular rhythm.  Pulmonary:     Effort: Pulmonary effort is normal.     Breath sounds: Normal breath sounds.  Abdominal:     General: Bowel sounds are normal.     Palpations: Abdomen is soft.  Musculoskeletal:        General: No swelling. Normal range of motion.     Cervical back:  Normal range of motion and neck supple.  Skin:    General: Skin is warm and dry.     Coloration: Skin is not jaundiced.  Neurological:     General: No focal deficit present.     Mental Status: She is alert and oriented to person, place, and time.  Psychiatric:        Mood and Affect: Mood normal.        Behavior: Behavior normal.      Assessment: *Chronic reflux-well-controlled on pantoprazole *Multiple adenomatous colon polyps  Plan: Continue on Protonix for patient's chronic reflux.  Refill sent in today.  I will also print off home practices to decrease reflux symptoms.  We will plan on surveillance colonoscopy October  2022 given numerous colon polyps on previous colonoscopy.  Otherwise she can follow-up as needed.  02/22/2020 9:17 AM   Disclaimer: This note was dictated with voice recognition software. Similar sounding words can inadvertently be transcribed and may not be corrected upon review.

## 2020-02-23 LAB — COMPREHENSIVE METABOLIC PANEL
ALT: 20 IU/L (ref 0–32)
AST: 14 IU/L (ref 0–40)
Albumin/Globulin Ratio: 1.7 (ref 1.2–2.2)
Albumin: 4.1 g/dL (ref 3.8–4.9)
Alkaline Phosphatase: 95 IU/L (ref 44–121)
BUN/Creatinine Ratio: 12 (ref 9–23)
BUN: 13 mg/dL (ref 6–24)
Bilirubin Total: 0.5 mg/dL (ref 0.0–1.2)
CO2: 25 mmol/L (ref 20–29)
Calcium: 9.5 mg/dL (ref 8.7–10.2)
Chloride: 102 mmol/L (ref 96–106)
Creatinine, Ser: 1.1 mg/dL — ABNORMAL HIGH (ref 0.57–1.00)
GFR calc Af Amer: 65 mL/min/{1.73_m2} (ref >59)
GFR calc non Af Amer: 56 mL/min/{1.73_m2} — ABNORMAL LOW (ref >59)
Globulin, Total: 2.4 g/dL (ref 1.5–4.5)
Glucose: 92 mg/dL (ref 65–99)
Potassium: 4.2 mmol/L (ref 3.5–5.2)
Sodium: 141 mmol/L (ref 134–144)
Total Protein: 6.5 g/dL (ref 6.0–8.5)

## 2020-02-23 LAB — LIPID PANEL
Chol/HDL Ratio: 5.4 ratio — ABNORMAL HIGH (ref 0.0–4.4)
Cholesterol, Total: 189 mg/dL (ref 100–199)
HDL: 35 mg/dL — ABNORMAL LOW (ref >39)
LDL Chol Calc (NIH): 121 mg/dL — ABNORMAL HIGH (ref 0–99)
Triglycerides: 188 mg/dL — ABNORMAL HIGH (ref 0–149)
VLDL Cholesterol Cal: 33 mg/dL (ref 5–40)

## 2020-02-23 LAB — T4: T4, Total: 10.3 ug/dL (ref 4.5–12.0)

## 2020-02-23 LAB — TSH: TSH: 3.26 u[IU]/mL (ref 0.450–4.500)

## 2020-02-23 LAB — MAGNESIUM: Magnesium: 2 mg/dL (ref 1.6–2.3)

## 2020-08-04 ENCOUNTER — Other Ambulatory Visit: Payer: Self-pay | Admitting: Family Medicine

## 2020-09-03 ENCOUNTER — Telehealth: Payer: Self-pay | Admitting: Family Medicine

## 2020-09-12 NOTE — Telephone Encounter (Signed)
Appointment 9/20

## 2020-10-06 ENCOUNTER — Other Ambulatory Visit: Payer: Self-pay | Admitting: Family Medicine

## 2020-10-09 ENCOUNTER — Other Ambulatory Visit: Payer: Self-pay

## 2020-10-09 ENCOUNTER — Encounter: Payer: Self-pay | Admitting: Family Medicine

## 2020-10-09 ENCOUNTER — Ambulatory Visit (INDEPENDENT_AMBULATORY_CARE_PROVIDER_SITE_OTHER): Payer: 59 | Admitting: Family Medicine

## 2020-10-09 VITALS — BP 107/71 | HR 65 | Temp 97.2°F | Wt 157.8 lb

## 2020-10-09 DIAGNOSIS — I10 Essential (primary) hypertension: Secondary | ICD-10-CM

## 2020-10-09 DIAGNOSIS — E876 Hypokalemia: Secondary | ICD-10-CM | POA: Diagnosis not present

## 2020-10-09 DIAGNOSIS — K118 Other diseases of salivary glands: Secondary | ICD-10-CM

## 2020-10-09 MED ORDER — AMLODIPINE BESYLATE 2.5 MG PO TABS
2.5000 mg | ORAL_TABLET | Freq: Every day | ORAL | 1 refills | Status: DC
Start: 2020-10-09 — End: 2021-01-11

## 2020-10-09 MED ORDER — POTASSIUM CHLORIDE ER 10 MEQ PO TBCR
EXTENDED_RELEASE_TABLET | ORAL | 1 refills | Status: DC
Start: 2020-10-09 — End: 2021-01-11

## 2020-10-09 MED ORDER — METOPROLOL TARTRATE 100 MG PO TABS
ORAL_TABLET | ORAL | 1 refills | Status: DC
Start: 2020-10-09 — End: 2021-01-11

## 2020-10-09 NOTE — Progress Notes (Addendum)
   Subjective:    Patient ID: Vanessa Rojas, female    DOB: 30-Sep-1965, 55 y.o.   MRN: [id number]  HPI Pt here for follow up on blood pressure. Pt states blood pressure has been doing well and she does check BP outside of office. Taking meds as directed.   Patient for blood pressure check up.  The patient does have hypertension.    Medication compliance-takes her medicine Watches diet Patient states that she had a spell where her parotid gland on the left side swelled up and then it swelled up on the right side a week or 2 later she states that she has never had this before.  She thought it was related to a possible allergy but she did not have any itching scratchy eyes or other such symptoms  Blood pressure control recently-states under decent control  Dietary compliance-tries to stay away from too much salty foods   Review of Systems     Objective:   Physical Exam General-in no acute distress Eyes-no discharge Lungs-respiratory rate normal, CTA CV-no murmurs,RRR Extremities skin warm dry no edema Neuro grossly normal Behavior normal, alert  On physical exam I feel no lymphadenopathy in the neck or around her jaw area      Assessment & Plan:  1. Hypokalemia Check lab work await results - Basic Metabolic Panel (BMET) - Magnesium  2. Primary hypertension Continue medication check lab work await results - Basic Metabolic Panel (BMET) - Magnesium  Counseled patient on quitting smoking unlikely she will do so Parotid gland swelling related to eating if this becomes a repetitive nature recommend ENT evaluation.  Currently right now patient states she will just watch this  Follow-up within 6 months.

## 2020-10-10 LAB — BASIC METABOLIC PANEL
BUN/Creatinine Ratio: 12 (ref 9–23)
BUN: 12 mg/dL (ref 6–24)
CO2: 25 mmol/L (ref 20–29)
Calcium: 9.4 mg/dL (ref 8.7–10.2)
Chloride: 101 mmol/L (ref 96–106)
Creatinine, Ser: 1.02 mg/dL — ABNORMAL HIGH (ref 0.57–1.00)
Glucose: 108 mg/dL — ABNORMAL HIGH (ref 65–99)
Potassium: 3.4 mmol/L — ABNORMAL LOW (ref 3.5–5.2)
Sodium: 140 mmol/L (ref 134–144)
eGFR: 65 mL/min/{1.73_m2} (ref >59)

## 2020-10-10 LAB — MAGNESIUM: Magnesium: 2 mg/dL (ref 1.6–2.3)

## 2020-10-12 ENCOUNTER — Other Ambulatory Visit: Payer: Self-pay | Admitting: Family Medicine

## 2020-10-12 DIAGNOSIS — E876 Hypokalemia: Secondary | ICD-10-CM

## 2020-11-01 ENCOUNTER — Encounter: Payer: Self-pay | Admitting: *Deleted

## 2021-01-11 ENCOUNTER — Other Ambulatory Visit: Payer: Self-pay | Admitting: Family Medicine

## 2021-04-08 ENCOUNTER — Ambulatory Visit (INDEPENDENT_AMBULATORY_CARE_PROVIDER_SITE_OTHER): Payer: 59 | Admitting: Family Medicine

## 2021-04-08 ENCOUNTER — Other Ambulatory Visit: Payer: Self-pay

## 2021-04-08 VITALS — BP 138/86 | HR 56 | Temp 97.5°F | Ht 60.0 in | Wt 156.0 lb

## 2021-04-08 DIAGNOSIS — I10 Essential (primary) hypertension: Secondary | ICD-10-CM | POA: Diagnosis not present

## 2021-04-08 DIAGNOSIS — Z13220 Encounter for screening for lipoid disorders: Secondary | ICD-10-CM | POA: Diagnosis not present

## 2021-04-08 MED ORDER — POTASSIUM CHLORIDE CRYS ER 10 MEQ PO TBCR: EXTENDED_RELEASE_TABLET | ORAL | 1 refills | Status: DC

## 2021-04-08 MED ORDER — METOPROLOL TARTRATE 100 MG PO TABS: ORAL_TABLET | ORAL | 1 refills | Status: DC

## 2021-04-08 MED ORDER — PANTOPRAZOLE SODIUM 40 MG PO TBEC: 40.0000 mg | DELAYED_RELEASE_TABLET | Freq: Two times a day (BID) | ORAL | 3 refills | Status: DC

## 2021-04-08 MED ORDER — AMLODIPINE BESYLATE 2.5 MG PO TABS: 2.5000 mg | ORAL_TABLET | Freq: Every day | ORAL | 1 refills | Status: DC

## 2021-04-08 NOTE — Patient Instructions (Signed)
It was good to see you today ?We will notify Dr. Abbey Chatters office regarding the colonoscopy ?Please call them to set up the appointment ?Please also do blood work within the next 2 weeks the orders will be sent to [facility] ?If you need anything let us know otherwise we will see you in 6 months ? ?Please do the best he can at eating healthy, fitting in walking, and I hope at some point cutting back and eventually quitting smoking ? ?TakeCare-Dr. Nicki Reaper ?

## 2021-04-08 NOTE — Progress Notes (Signed)
? ?  Subjective:  ? ? Patient ID: Vanessa Rojas, female    DOB: 1965/06/16, 56 y.o.   MRN: [id number] ? ?Hypertension ?This is a chronic problem. The problem is controlled. Treatments tried: amlodipine , metoprolol.  ?Gastroesophageal Reflux ?On pantoprazole  ?Primary hypertension - Plan: Basic metabolic panel, Lipid panel, Hepatic function panel ? ?Screening for cholesterol level - Plan: Basic metabolic panel, Lipid panel, Hepatic function panel ? ? ? ?Review of Systems ? ?   ?Objective:  ? Physical Exam ? ?General-in no acute distress ?Eyes-no discharge ?Lungs-respiratory rate normal, CTA ?CV-no murmurs,RRR ?Extremities skin warm dry no edema ?Neuro grossly normal ?Behavior normal, alert ? ? ? ?   ?Assessment & Plan:  ?1. Primary hypertension ?Blood pressure under decent control she needs to do a better job with getting walking into her routine.  Also maintain healthy eating and minimize salt.  It would be very helpful for her to quit smoking but right at the moment this is not going to happen she will try to cut back ?- Basic metabolic panel ?- Lipid panel ?- Hepatic function panel ? ?2. Screening for cholesterol level ?Lipid profile ?- Basic metabolic panel ?- Lipid panel ?- Hepatic function panel ? ?Patient encouraged to quit smoking ? ?Patient encouraged to go back to gastroenterology for colonoscopy ?

## 2021-04-09 ENCOUNTER — Other Ambulatory Visit: Payer: Self-pay

## 2021-04-09 DIAGNOSIS — Z1211 Encounter for screening for malignant neoplasm of colon: Secondary | ICD-10-CM

## 2021-04-15 ENCOUNTER — Encounter: Payer: Self-pay | Admitting: Internal Medicine

## 2021-04-23 DIAGNOSIS — Z79899 Other long term (current) drug therapy: Secondary | ICD-10-CM | POA: Diagnosis not present

## 2021-04-23 DIAGNOSIS — E669 Obesity, unspecified: Secondary | ICD-10-CM | POA: Diagnosis not present

## 2021-04-23 DIAGNOSIS — R5383 Other fatigue: Secondary | ICD-10-CM | POA: Diagnosis not present

## 2021-04-23 DIAGNOSIS — M459 Ankylosing spondylitis of unspecified sites in spine: Secondary | ICD-10-CM | POA: Diagnosis not present

## 2021-04-23 DIAGNOSIS — Z1589 Genetic susceptibility to other disease: Secondary | ICD-10-CM | POA: Diagnosis not present

## 2021-04-23 DIAGNOSIS — Z6830 Body mass index (BMI) 30.0-30.9, adult: Secondary | ICD-10-CM | POA: Diagnosis not present

## 2021-04-23 DIAGNOSIS — H209 Unspecified iridocyclitis: Secondary | ICD-10-CM | POA: Diagnosis not present

## 2021-05-12 IMAGING — DX DG CHEST 2V
2 series · 2 of 2 positions shown · non-contrast
Comparison: 06/29/2015

CLINICAL DATA: Cough

EXAM:
CHEST - 2 VIEW

[chest pa]
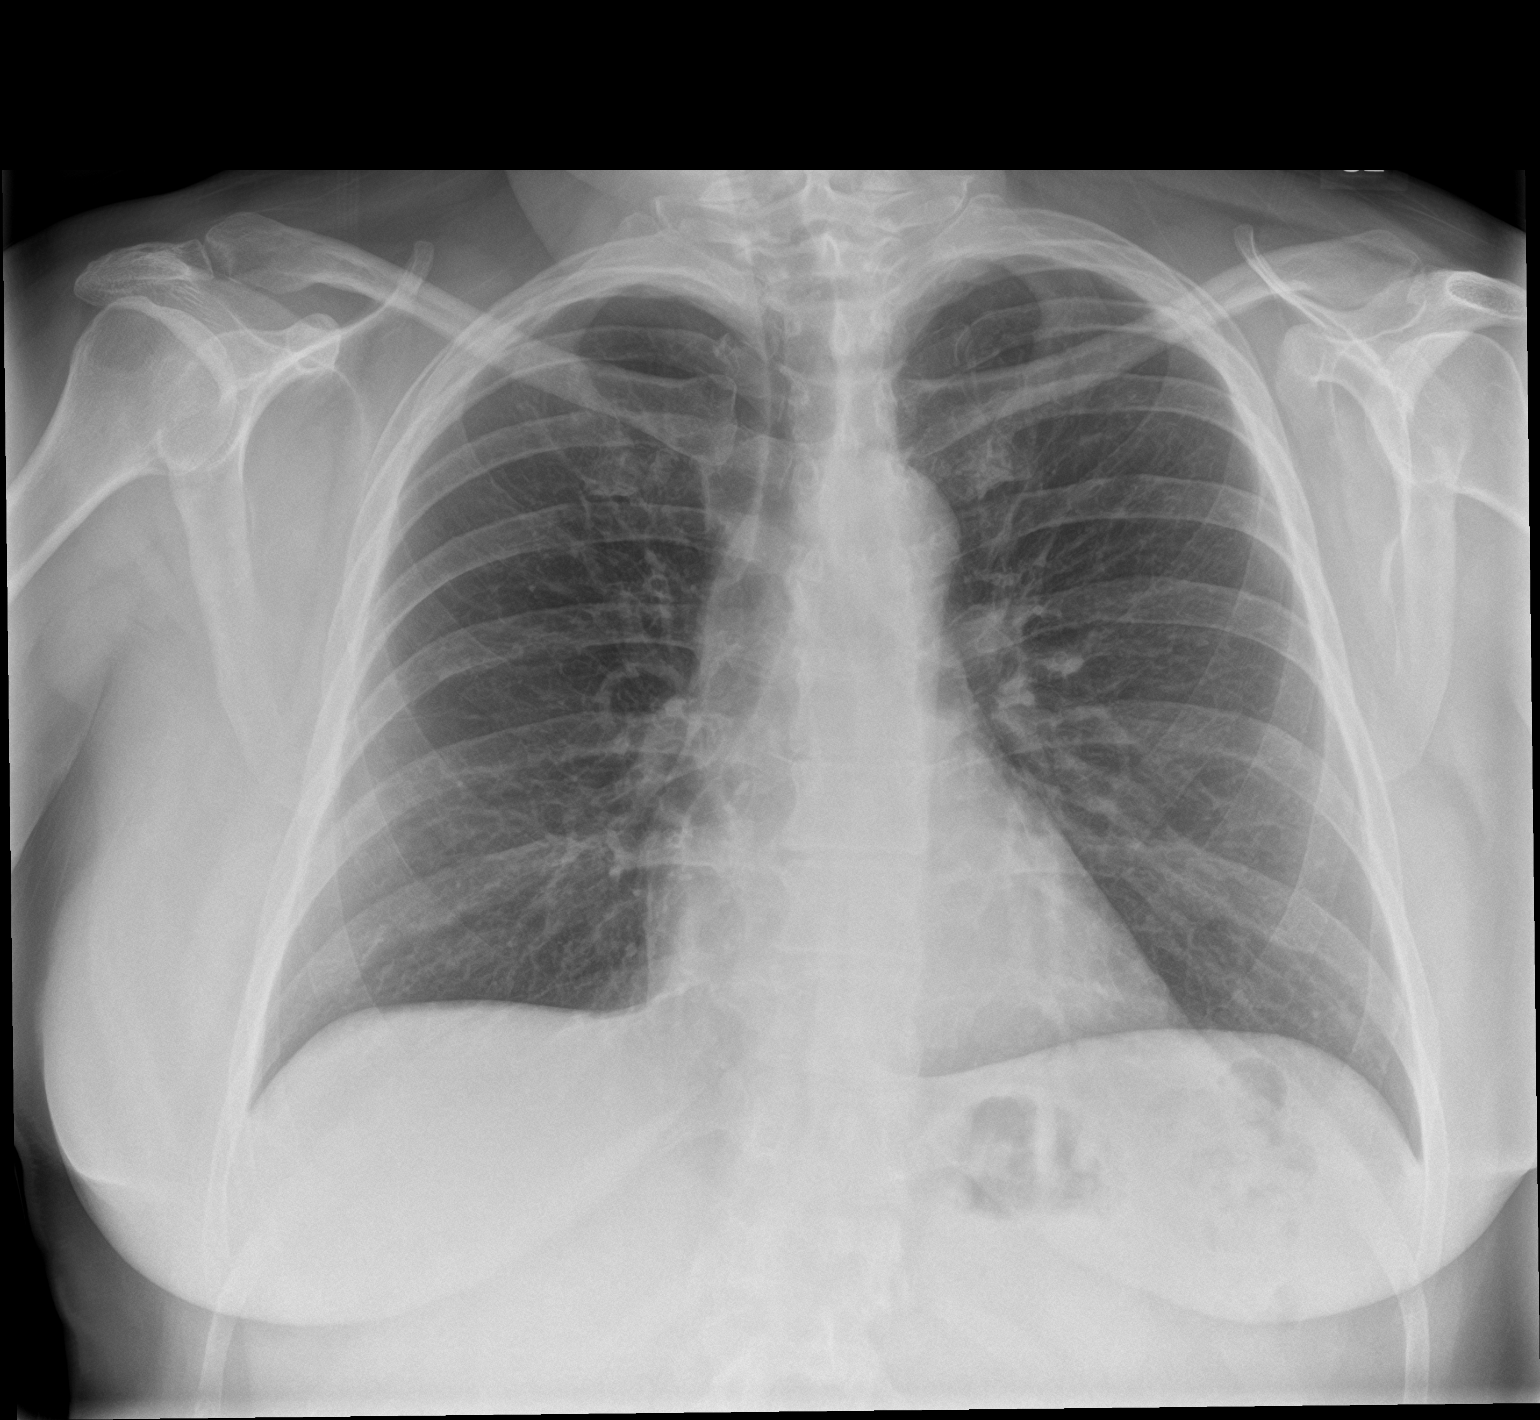

[chest lat]
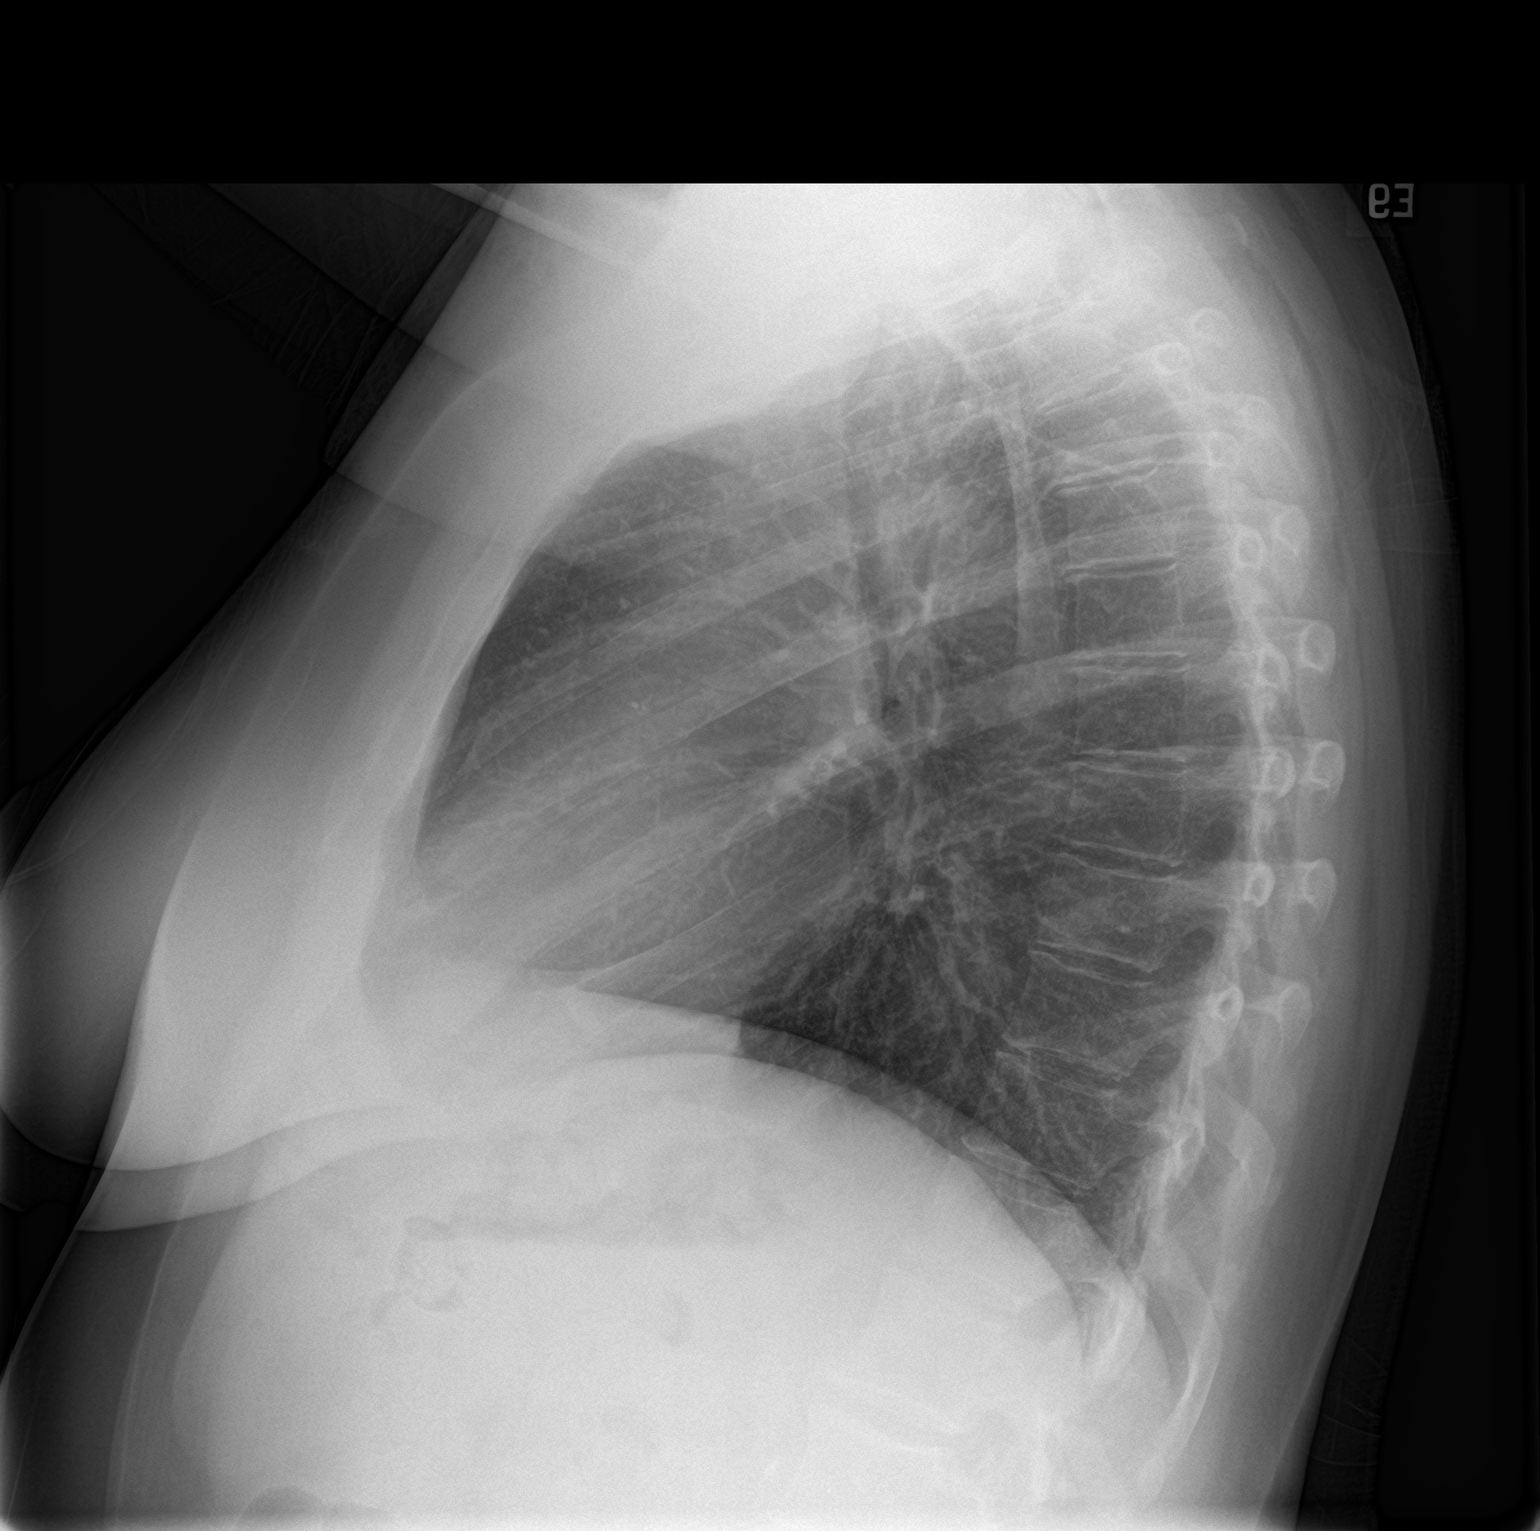

[2 of 2 positions shown; findings below may reference images not displayed]

FINDINGS: The heart size and mediastinal contours are within normal limits.
Both lungs are clear. The visualized skeletal structures are
unremarkable.
IMPRESSION: No active cardiopulmonary disease.

## 2021-05-24 ENCOUNTER — Other Ambulatory Visit: Payer: Self-pay | Admitting: Family Medicine

## 2021-06-12 ENCOUNTER — Ambulatory Visit (INDEPENDENT_AMBULATORY_CARE_PROVIDER_SITE_OTHER): Payer: Self-pay | Admitting: *Deleted

## 2021-06-12 VITALS — Ht 60.0 in | Wt 152.0 lb

## 2021-06-12 DIAGNOSIS — Z86010 Personal history of colonic polyps: Secondary | ICD-10-CM

## 2021-06-12 NOTE — Progress Notes (Addendum)
Gastroenterology Pre-Procedure Review  Request Date:06/12/2021 Requesting Physician: Last TCS 11/14/2019 done by Dr. Abbey Chatters, numerous colon polyps (greater than 10), tubular adenoma, hyperplastic polyps, family hx of colon cancer (brother)  PATIENT REVIEW QUESTIONS: The patient responded to the following health history questions as indicated:    1. Diabetes Melitis: no 2. Joint replacements in the past 12 months: no 3. Major health problems in the past 3 months: no 4. Has an artificial valve or MVP: no 5. Has a defibrillator: no 6. Has been advised in past to take antibiotics in advance of a procedure like teeth cleaning: no 7. Family history of colon cancer: yes, brother: age late 64's  8. Alcohol Use: no 9. Illicit drug Use: no 10. History of sleep apnea: no  11. History of coronary artery or other vascular stents placed within the last 12 months: no 12. History of any prior anesthesia complications: no 13. Body mass index is 29.69 kg/m.    MEDICATIONS & ALLERGIES:    Patient reports the following regarding taking any blood thinners:   Plavix? no Aspirin? no Coumadin? no Brilinta? no Xarelto? no Eliquis? no Pradaxa? no Savaysa? no Effient? no  Patient confirms/reports the following medications:  Current Outpatient Medications  Medication Sig Dispense Refill   amLODipine (NORVASC) 2.5 MG tablet TAKE 1 TABLET BY MOUTH ONCE DAILY. 30 tablet 0   cyanocobalamin 1000 MCG tablet Take 1,000 mcg by mouth daily.     HUMIRA PEN 40 MG/0.4ML PNKT Inject 40 mg into the skin every 14 (fourteen) days.      metoprolol tartrate (LOPRESSOR) 100 MG tablet TAKE (1/2) TABLET BY MOUTH TWICE DAILY. 30 tablet 0   potassium chloride (KLOR-CON M) 10 MEQ tablet TAKE (2) TABLETS BY MOUTH TWICE DAILY. 360 tablet 1   No current facility-administered medications for this visit.    Patient confirms/reports the following allergies:  Allergies  Allergen Reactions   Penicillins Other (See Comments)     Childhood allergy.   Wellbutrin [Bupropion] Nausea And Vomiting    No orders of the defined types were placed in this encounter.   AUTHORIZATION INFORMATION Primary Insurance: [place] ,  ID #: [id number] Pre-Cert / Josem Kaufmann required: No, not required per Tilden Fossa / Auth #: REF#: [id number]  SCHEDULE INFORMATION: Procedure has been scheduled as follows:  Date: 06/28/2021, Time: 1:30 Location: [facility] with Dr. Abbey Chatters  This Gastroenterology Pre-Precedure Review Form is being routed to the following provider(s): Venetia Night, NP

## 2021-06-13 ENCOUNTER — Encounter: Payer: Self-pay | Admitting: *Deleted

## 2021-06-13 MED ORDER — CLENPIQ 10-3.5-12 MG-GM -GM/160ML PO SOLN: 1.0000 | Freq: Once | ORAL | 0 refills | Status: AC

## 2021-06-13 NOTE — Progress Notes (Signed)
ASA 2, okay to schedule. Needs BMP prior to procedure due to history of hypokalemia and taking potassium.

## 2021-06-13 NOTE — Addendum Note (Signed)
Addended by: Metro Kung on: 06/13/2021 04:47 PM   Modules accepted: Orders

## 2021-06-13 NOTE — Progress Notes (Signed)
Spoke to pt.  Scheduled procedure for 06/28/2021 at 1:30, arrival 12:00 at [facility].  Reviewed prep instructions with pt by phone.  Pt aware that I sent Clenpiq to her pharmacy.  She did not want Trilyte nor Suprep.  Confirmed mailing address and mailed instructions.  Pt aware that she needs labs drawn on 06/26/2021 at [facility].

## 2021-06-13 NOTE — Progress Notes (Signed)
06/13/2021-Lmom on both numbers for pt to call me back.

## 2021-06-14 ENCOUNTER — Other Ambulatory Visit: Payer: Self-pay | Admitting: *Deleted

## 2021-06-14 ENCOUNTER — Encounter: Payer: Self-pay | Admitting: *Deleted

## 2021-06-14 DIAGNOSIS — Z860100 Personal history of colon polyps, unspecified: Secondary | ICD-10-CM

## 2021-06-14 DIAGNOSIS — Z1211 Encounter for screening for malignant neoplasm of colon: Secondary | ICD-10-CM

## 2021-06-26 ENCOUNTER — Other Ambulatory Visit (HOSPITAL_COMMUNITY)
Admission: RE | Admit: 2021-06-26 | Discharge: 2021-06-26 | Disposition: A | Discharge status: Home / Self Care | Payer: 59 | Source: Ambulatory Visit | Attending: Internal Medicine | Admitting: Internal Medicine

## 2021-06-26 DIAGNOSIS — Z86010 Personal history of colonic polyps: Secondary | ICD-10-CM | POA: Insufficient documentation

## 2021-06-26 LAB — BASIC METABOLIC PANEL
Anion gap: 4 — ABNORMAL LOW (ref 5–15)
BUN: 12 mg/dL (ref 6–20)
CO2: 28 mmol/L (ref 22–32)
Calcium: 9.2 mg/dL (ref 8.9–10.3)
Chloride: 106 mmol/L (ref 98–111)
Creatinine, Ser: 0.93 mg/dL (ref 0.44–1.00)
GFR, Estimated: >60 mL/min (ref >60)
Glucose, Bld: 103 mg/dL — ABNORMAL HIGH (ref 70–99)
Potassium: 4.1 mmol/L (ref 3.5–5.1)
Sodium: 138 mmol/L (ref 135–145)

## 2021-06-27 ENCOUNTER — Telehealth: Payer: Self-pay | Admitting: *Deleted

## 2021-06-27 NOTE — Telephone Encounter (Signed)
Pt called in. Unable to keep prep down even drinking small sips, through straw.  Spoke with Dr. Abbey Chatters. Give miralax prep.  Called pt and made aware. She is going to pull her instructions from [facility] now.

## 2021-06-28 ENCOUNTER — Encounter (HOSPITAL_COMMUNITY)
Admission: RE | Disposition: A | Discharge status: Home / Self Care | Payer: Self-pay | Source: Ambulatory Visit | Attending: Internal Medicine

## 2021-06-28 ENCOUNTER — Ambulatory Visit (HOSPITAL_COMMUNITY): Payer: 59 | Admitting: Certified Registered Nurse Anesthetist

## 2021-06-28 ENCOUNTER — Ambulatory Visit (HOSPITAL_BASED_OUTPATIENT_CLINIC_OR_DEPARTMENT_OTHER): Payer: 59 | Admitting: Certified Registered Nurse Anesthetist

## 2021-06-28 ENCOUNTER — Ambulatory Visit (HOSPITAL_COMMUNITY)
Admission: RE | Admit: 2021-06-28 | Discharge: 2021-06-28 | Disposition: A | Discharge status: Home / Self Care | Payer: 59 | Source: Ambulatory Visit | Attending: Internal Medicine | Admitting: Internal Medicine

## 2021-06-28 ENCOUNTER — Other Ambulatory Visit: Payer: Self-pay

## 2021-06-28 ENCOUNTER — Encounter (HOSPITAL_COMMUNITY): Payer: Self-pay

## 2021-06-28 DIAGNOSIS — D125 Benign neoplasm of sigmoid colon: Secondary | ICD-10-CM | POA: Insufficient documentation

## 2021-06-28 DIAGNOSIS — D124 Benign neoplasm of descending colon: Secondary | ICD-10-CM | POA: Diagnosis not present

## 2021-06-28 DIAGNOSIS — F17210 Nicotine dependence, cigarettes, uncomplicated: Secondary | ICD-10-CM | POA: Diagnosis not present

## 2021-06-28 DIAGNOSIS — K635 Polyp of colon: Secondary | ICD-10-CM

## 2021-06-28 DIAGNOSIS — K644 Residual hemorrhoidal skin tags: Secondary | ICD-10-CM | POA: Diagnosis not present

## 2021-06-28 DIAGNOSIS — K648 Other hemorrhoids: Secondary | ICD-10-CM | POA: Insufficient documentation

## 2021-06-28 DIAGNOSIS — J45909 Unspecified asthma, uncomplicated: Secondary | ICD-10-CM | POA: Diagnosis not present

## 2021-06-28 DIAGNOSIS — I10 Essential (primary) hypertension: Secondary | ICD-10-CM | POA: Insufficient documentation

## 2021-06-28 DIAGNOSIS — K219 Gastro-esophageal reflux disease without esophagitis: Secondary | ICD-10-CM | POA: Insufficient documentation

## 2021-06-28 DIAGNOSIS — Z1211 Encounter for screening for malignant neoplasm of colon: Secondary | ICD-10-CM | POA: Diagnosis not present

## 2021-06-28 DIAGNOSIS — Z86010 Personal history of colonic polyps: Secondary | ICD-10-CM | POA: Diagnosis not present

## 2021-06-28 DIAGNOSIS — Z09 Encounter for follow-up examination after completed treatment for conditions other than malignant neoplasm: Secondary | ICD-10-CM | POA: Diagnosis not present

## 2021-06-28 DIAGNOSIS — R69 Illness, unspecified: Secondary | ICD-10-CM | POA: Diagnosis not present

## 2021-06-28 HISTORY — PX: POLYPECTOMY: SHX5525

## 2021-06-28 HISTORY — PX: COLONOSCOPY WITH PROPOFOL: SHX5780

## 2021-06-28 SURGERY — COLONOSCOPY WITH PROPOFOL
Anesthesia: General

## 2021-06-28 MED ORDER — LIDOCAINE HCL (CARDIAC) PF 100 MG/5ML IV SOSY
PREFILLED_SYRINGE | INTRAVENOUS | Status: DC | PRN
  Administered 2021-06-28: 50 mg via INTRATRACHEAL

## 2021-06-28 MED ORDER — PROPOFOL 10 MG/ML IV BOLUS
INTRAVENOUS | Status: DC | PRN
Start: 2021-06-28 — End: 2021-06-28
  Administered 2021-06-28: 40 mg via INTRAVENOUS
  Administered 2021-06-28 (×3): 30 mg via INTRAVENOUS
  Administered 2021-06-28: 100 mg via INTRAVENOUS
  Administered 2021-06-28: 40 mg via INTRAVENOUS

## 2021-06-28 MED ORDER — LACTATED RINGERS IV SOLN: INTRAVENOUS | Status: DC

## 2021-06-28 NOTE — H&P (Signed)
Primary Care Physician:  Kathyrn Drown, MD Primary Gastroenterologist:  Dr. Abbey Chatters  Pre-Procedure History & Physical: HPI:  Vanessa Rojas is a 56 y.o. female is here for a colonoscopy to be performed for surveillance purposes, history of numerous adenomatous polyps (>10) on colonoscopy October 2021  Past Medical History:  Diagnosis Date   Ankylosing spondylitis ([place]) 02/20/2019   Followed by rheumatologist Dr. Gavin Pound [facility] rheumatology, Humira, January 2021   B12 deficiency 03/09/2019   B12 level 291 replenish with shots then oral medication February 2021   Hypertension    Hypokalemia 05/24/2019    Past Surgical History:  Procedure Laterality Date   BIOPSY  12/12/2019   Procedure: BIOPSY;  Surgeon: Eloise Harman, DO;  [facility];  Service: Endoscopy;;   CESAREAN SECTION  july 2000   COLONOSCOPY WITH PROPOFOL N/A 11/14/2019   Procedure: COLONOSCOPY WITH PROPOFOL;  Surgeon: Eloise Harman, DO;  [facility];  Service: Endoscopy;  Laterality: N/A;  10:30   ELBOW SURGERY Right child hood   ESOPHAGOGASTRODUODENOSCOPY (EGD) WITH PROPOFOL N/A 12/12/2019   Procedure: ESOPHAGOGASTRODUODENOSCOPY (EGD) WITH PROPOFOL;  Surgeon: Eloise Harman, DO;  [facility];  Service: Endoscopy;  Laterality: N/A;  9:45am   POLYPECTOMY  11/14/2019   Procedure: POLYPECTOMY;  Surgeon: Eloise Harman, DO;  [facility];  Service: Endoscopy;;   TONSILLECTOMY      Prior to Admission medications   Medication Sig Start Date End Date Taking? Authorizing Provider  amLODipine (NORVASC) 2.5 MG tablet TAKE 1 TABLET BY MOUTH ONCE DAILY. 05/24/21  Yes Kathyrn Drown, MD  cyanocobalamin 1000 MCG tablet Take 1,000 mcg by mouth daily.   Yes [provider]  HUMIRA PEN 40 MG/0.4ML PNKT Inject 40 mg into the skin every 14 (fourteen) days.  05/13/19  Yes [provider]  metoprolol tartrate (LOPRESSOR) 100 MG tablet TAKE (1/2) TABLET BY  MOUTH TWICE DAILY. 05/24/21  Yes Kathyrn Drown, MD  potassium chloride (KLOR-CON M) 10 MEQ tablet TAKE (2) TABLETS BY MOUTH TWICE DAILY. 04/08/21  Yes Kathyrn Drown, MD  acetaminophen (TYLENOL) 500 MG tablet Take 500 mg by mouth every 6 (six) hours as needed for moderate pain.    [provider]    Allergies as of 06/14/2021 - Review Complete 06/12/2021  Allergen Reaction Noted   Penicillins Other (See Comments) 06/08/2012   Wellbutrin [bupropion] Nausea And Vomiting 12/05/2014    Family History  Problem Relation Age of Onset   Cancer Mother        lung, liver, breast   Cancer Father    Hyperlipidemia Father    Hypertension Father    Cancer Brother        colon   Diabetes Maternal Aunt    Diabetes Maternal Grandmother     Social History   Socioeconomic History   Marital status: Married    Spouse name: Not on file   Number of children: Not on file   Years of education: Not on file   Highest education level: Not on file  Occupational History   Not on file  Tobacco Use   Smoking status: Every Day    Packs/day: 0.75    Types: Cigarettes    Last attempt to quit: 11/15/2014    Years since quitting: 6.6   Smokeless tobacco: Never  Vaping Use   Vaping Use: Never used  Substance and Sexual Activity   Alcohol use: No    Alcohol/week:  0.0 standard drinks of alcohol   Drug use: No   Sexual activity: Not on file  Other Topics Concern   Not on file  Social History Narrative   Not on file   Social Determinants of Health   Financial Resource Strain: Not on file  Food Insecurity: Not on file  Transportation Needs: Not on file  Physical Activity: Not on file  Stress: Not on file  Social Connections: Not on file  Intimate Partner Violence: Not on file    Review of Systems: See HPI, otherwise negative ROS  Physical Exam: Vital signs in last 24 hours: Temp:  [97.9 F (36.6 C)] 97.9 F (36.6 C) (06/09 1217) Pulse Rate:  [60] 60 (06/09 1217) Resp:  [22] 22  (06/09 1217) BP: (131)/(77) 131/77 (06/09 1217) SpO2:  [98 %] 98 % (06/09 1217)   General:   Alert,  Well-developed, well-nourished, pleasant and cooperative in NAD Head:  Normocephalic and atraumatic. Eyes:  Sclera clear, no icterus.   Conjunctiva pink. Ears:  Normal auditory acuity. Nose:  No deformity, discharge,  or lesions. Mouth:  No deformity or lesions, dentition normal. Neck:  Supple; no masses or thyromegaly. Lungs:  Clear throughout to auscultation.   No wheezes, crackles, or rhonchi. No acute distress. Heart:  Regular rate and rhythm; no murmurs, clicks, rubs,  or gallops. Abdomen:  Soft, nontender and nondistended. No masses, hepatosplenomegaly or hernias noted. Normal bowel sounds, without guarding, and without rebound.   Msk:  Symmetrical without gross deformities. Normal posture. Extremities:  Without clubbing or edema. Neurologic:  Alert and  oriented x4;  grossly normal neurologically. Skin:  Intact without significant lesions or rashes. Cervical Nodes:  No significant cervical adenopathy. Psych:  Alert and cooperative. Normal mood and affect.  Impression/Plan: Vanessa Rojas is here for a colonoscopy to be performed for surveillance purposes, history of numerous adenomatous polyps (>10) on colonoscopy October 2021  The risks of the procedure including infection, bleed, or perforation as well as benefits, limitations, alternatives and imponderables have been reviewed with the patient. Questions have been answered. All parties agreeable.

## 2021-06-28 NOTE — Transfer of Care (Signed)
Immediate Anesthesia Transfer of Care Note  Patient: Vanessa Rojas  Procedure(s) Performed: COLONOSCOPY WITH PROPOFOL POLYPECTOMY  Patient Location: Short Stay  Anesthesia Type:General  Level of Consciousness: awake  Airway & Oxygen Therapy: Patient Spontanous Breathing  Post-op Assessment: Report given to RN and Post -op Vital signs reviewed and stable  Post vital signs: Reviewed and stable  Last Vitals:  Vitals Value Taken Time  BP    Temp    Pulse    Resp    SpO2      Last Pain:  Vitals:   06/28/21 1243  TempSrc:   PainSc: 0-No pain         Complications: No notable events documented.

## 2021-06-28 NOTE — Op Note (Signed)
[facility] Patient Name: Vanessa Rojas Procedure Date: 06/28/2021 12:34 PM MRN: [id number] Date of Birth: 10/08/1965 Attending MD: Elon Alas. Abbey Chatters DO CSN: [id number] Age: 56 Admit Type: Outpatient Procedure:                Colonoscopy Indications:              High risk colon cancer surveillance: Personal                            history of multiple (3 or more) adenomas Providers:                Elon Alas. Abbey Chatters, DO, Caprice Kluver, Raphael Gibney,                            Technician Referring MD:              Medicines:                See the Anesthesia note for documentation of the                            administered medications Complications:            No immediate complications. Estimated Blood Loss:     Estimated blood loss was minimal. Procedure:                Pre-Anesthesia Assessment:                           - The anesthesia plan was to use monitored                            anesthesia care (MAC).                           After obtaining informed consent, the colonoscope                            was passed under direct vision. Throughout the                            procedure, the patient's blood pressure, pulse, and                            oxygen saturations were monitored continuously. The                            [id number]-HQ190L ([id number]) scope was introduced through                            the anus and advanced to the the cecum, identified                            by appendiceal orifice and ileocecal valve. The                            colonoscopy was performed without difficulty.  The                            patient tolerated the procedure well. The quality                            of the bowel preparation was evaluated using the                            BBPS [facility] Bowel Preparation Scale) with scores                            of: Right Colon = 3, Transverse Colon = 3 and Left                            Colon = 3 (entire mucosa  seen well with no residual                            staining, small fragments of stool or opaque                            liquid). The total BBPS score equals 9. Findings:      Hemorrhoids were found on perianal exam.      Non-bleeding internal hemorrhoids were found during endoscopy.      A 5 mm polyp was found in the descending colon. The polyp was sessile.       The polyp was removed with a cold snare. Resection and retrieval were       complete.      Five sessile polyps were found in the sigmoid colon. The polyps were 4       to 6 mm in size. These polyps were removed with a cold snare. Resection       and retrieval were complete.      The exam was otherwise without abnormality. Impression:               - Hemorrhoids found on perianal exam.                           - Non-bleeding internal hemorrhoids.                           - One 5 mm polyp in the descending colon, removed                            with a cold snare. Resected and retrieved.                           - Five 4 to 6 mm polyps in the sigmoid colon,                            removed with a cold snare. Resected and retrieved.                           - The examination was otherwise normal.  Moderate Sedation:      Per Anesthesia Care Recommendation:           - Patient has a contact number available for                            emergencies. The signs and symptoms of potential                            delayed complications were discussed with the                            patient. Return to normal activities tomorrow.                            Written discharge instructions were provided to the                            patient.                           - Resume previous diet.                           - Continue present medications.                           - Await pathology results.                           - Repeat colonoscopy in 3 - 5 years for                            surveillance.                            - Return to GI clinic PRN. Procedure Code(s):        --- Professional ---                           [phone], Colonoscopy, flexible; with removal of                            tumor(s), polyp(s), or other lesion(s) by snare                            technique Diagnosis Code(s):        --- Professional ---                           K63.5, Polyp of colon                           Z86.010, Personal history of colonic polyps                           K64.8, Other hemorrhoids CPT copyright 2019 American Medical Association. All rights reserved. The codes documented in this  report are preliminary and upon coder review may  be revised to meet current compliance requirements. Elon Alas. Abbey Chatters, DO [place] Abbey Chatters, DO 06/28/2021 1:04:35 PM This report has been signed electronically. Number of Addenda: 0

## 2021-06-28 NOTE — Discharge Instructions (Addendum)
  Colonoscopy Discharge Instructions  Read the instructions outlined below and refer to this sheet in the next few weeks. These discharge instructions provide you with general information on caring for yourself after you leave the hospital. Your doctor may also give you specific instructions. While your treatment has been planned according to the most current medical practices available, unavoidable complications occasionally occur.   ACTIVITY You may resume your regular activity, but move at a slower pace for the next 24 hours.  Take frequent rest periods for the next 24 hours.  Walking will help get rid of the air and reduce the bloated feeling in your belly (abdomen).  No driving for 24 hours (because of the medicine (anesthesia) used during the test).   Do not sign any important legal documents or operate any machinery for 24 hours (because of the anesthesia used during the test).  NUTRITION Drink plenty of fluids.  You may resume your normal diet as instructed by your doctor.  Begin with a light meal and progress to your normal diet. Heavy or fried foods are harder to digest and may make you feel sick to your stomach (nauseated).  Avoid alcoholic beverages for 24 hours or as instructed.  MEDICATIONS You may resume your normal medications unless your doctor tells you otherwise.  WHAT YOU CAN EXPECT TODAY Some feelings of bloating in the abdomen.  Passage of more gas than usual.  Spotting of blood in your stool or on the toilet paper.  IF YOU HAD POLYPS REMOVED DURING THE COLONOSCOPY: No aspirin products for 7 days or as instructed.  No alcohol for 7 days or as instructed.  Eat a soft diet for the next 24 hours.  FINDING OUT THE RESULTS OF YOUR TEST Not all test results are available during your visit. If your test results are not back during the visit, make an appointment with your caregiver to find out the results. Do not assume everything is normal if you have not heard from your  caregiver or the medical facility. It is important for you to follow up on all of your test results.  SEEK IMMEDIATE MEDICAL ATTENTION IF: You have more than a spotting of blood in your stool.  Your belly is swollen (abdominal distention).  You are nauseated or vomiting.  You have a temperature over 101.  You have abdominal pain or discomfort that is severe or gets worse throughout the day.   Your colonoscopy revealed 5 polyp(s) which I removed successfully. Await pathology results, my office will contact you. I recommend repeating colonoscopy in 3-5 years for surveillance purposes. Otherwise follow up with GI as needed.    I hope you have a great rest of your week!  Elon Alas. Abbey Chatters, D.O. Gastroenterology and Hepatology [facility]

## 2021-06-28 NOTE — Anesthesia Preprocedure Evaluation (Signed)
Anesthesia Evaluation  Patient identified by MRN, date of birth, ID band Patient awake    Reviewed: Allergy & Precautions, H&P , NPO status , Patient's Chart, lab work & pertinent test results, reviewed documented beta blocker date and time   Airway Mallampati: II  TM Distance: >3 FB Neck ROM: full    Dental no notable dental hx.    Pulmonary asthma , Current Smoker and Patient abstained from smoking.,    Pulmonary exam normal breath sounds clear to auscultation       Cardiovascular Exercise Tolerance: Good hypertension, negative cardio ROS   Rhythm:regular Rate:Normal     Neuro/Psych TIAnegative psych ROS   GI/Hepatic Neg liver ROS, GERD  Medicated,  Endo/Other  negative endocrine ROS  Renal/GU negative Renal ROS  negative genitourinary   Musculoskeletal   Abdominal   Peds  Hematology negative hematology ROS (+)   Anesthesia Other Findings   Reproductive/Obstetrics negative OB ROS                             Anesthesia Physical Anesthesia Plan  ASA: 3  Anesthesia Plan: General   Post-op Pain Management:    Induction:   PONV Risk Score and Plan: Propofol infusion  Airway Management Planned:   Additional Equipment:   Intra-op Plan:   Post-operative Plan:   Informed Consent: I have reviewed the patients History and Physical, chart, labs and discussed the procedure including the risks, benefits and alternatives for the proposed anesthesia with the patient or authorized representative who has indicated his/her understanding and acceptance.     Dental Advisory Given  Plan Discussed with: CRNA  Anesthesia Plan Comments:         Anesthesia Quick Evaluation

## 2021-06-30 NOTE — Anesthesia Postprocedure Evaluation (Signed)
Anesthesia Post Note  Patient: [name]  Procedure(s) Performed: COLONOSCOPY WITH PROPOFOL POLYPECTOMY  Patient location during evaluation: Phase II Anesthesia Type: General Level of consciousness: awake Pain management: pain level controlled Vital Signs Assessment: post-procedure vital signs reviewed and stable Respiratory status: spontaneous breathing and respiratory function stable Cardiovascular status: blood pressure returned to baseline and stable Postop Assessment: no headache and no apparent nausea or vomiting Anesthetic complications: no Comments: Late entry   No notable events documented.   Last Vitals:  Vitals:   06/28/21 1217 06/28/21 1305  BP: 131/77 96/68  Pulse: 60   Resp: (!) 22 18  Temp: 36.6 C 36.5 C  SpO2: 98% 96%    Last Pain:  Vitals:   06/28/21 1305  TempSrc: Oral  PainSc: 0-No pain                 Louann Sjogren

## 2021-07-02 ENCOUNTER — Ambulatory Visit (INDEPENDENT_AMBULATORY_CARE_PROVIDER_SITE_OTHER): Payer: 59 | Admitting: Nurse Practitioner

## 2021-07-02 VITALS — BP 146/88 | HR 60 | Temp 97.3°F | Ht 60.0 in | Wt 155.0 lb

## 2021-07-02 DIAGNOSIS — L989 Disorder of the skin and subcutaneous tissue, unspecified: Secondary | ICD-10-CM

## 2021-07-02 LAB — SURGICAL PATHOLOGY

## 2021-07-02 MED ORDER — MUPIROCIN 2 % EX OINT: 1.0000 "application " | TOPICAL_OINTMENT | Freq: Two times a day (BID) | CUTANEOUS | 0 refills | Status: AC

## 2021-07-02 MED ORDER — DOXYCYCLINE HYCLATE 50 MG PO CAPS: 100.0000 mg | ORAL_CAPSULE | Freq: Two times a day (BID) | ORAL | 0 refills | Status: AC

## 2021-07-02 MED ORDER — TRIAMCINOLONE ACETONIDE 0.1 % EX CREA: 1.0000 "application " | TOPICAL_CREAM | Freq: Two times a day (BID) | CUTANEOUS | 0 refills | Status: DC

## 2021-07-02 NOTE — Progress Notes (Signed)
   Subjective:    Patient ID: Vanessa Rojas, female    DOB: 1965/10/09, 56 y.o.   MRN: [id number]  HPI  Patient has 3 knots or more in head. Noticed on 06/20/21.  Patient states that she went to the hair salon on May 31.  Patient states that she notices knots a day after she went to the hair salon.  Patient states that the lesions do hurt but denies drainage and itchiness.    Patient states that areas are red but it seems as though knots have gotten smaller.  Review of Systems  Skin:        3 lesions to the scalp  All other systems reviewed and are negative.      Objective:   Physical Exam HENT:     Head: Normocephalic and atraumatic.     Comments: Lesion 1.  First lesion located to the left side of patient's head approximately 3 to 5 cm above the ear.  Scaly reddened area area approximately 1 cm noted with scab.  No drainage noted. Lesion 2.  Second lesion noted closely to the top of patient's head.  Less than 1 cm area also scaly and red with small scab.  No drainage noted Lesion 3.  Third lesion noted to the top right of patient's head approximately 0.5 cm.  Also scaly and red.  No scabs noted or drainage. Cardiovascular:     Rate and Rhythm: Normal rate and regular rhythm.     Pulses: Normal pulses.     Heart sounds: Normal heart sounds. No murmur heard. Pulmonary:     Effort: Pulmonary effort is normal. No respiratory distress.  Musculoskeletal:     Comments: Grossly intact  Skin:    Capillary Refill: Capillary refill takes less than 2 seconds.     Comments: See section under HENT  Neurological:     Comments: Grossly intact  Psychiatric:        Mood and Affect: Mood normal.        Behavior: Behavior normal.        Assessment & Plan:   1. Lesion of skin of scalp -Likely bacterial -We will trial patient on doxycycline and Bactroban. -If Bactroban does not help patient may try triamcinolone cream to the area -If triamcinolone not helpful may consider fungal etiology  at that time and trial antifungal cream and shampoo -If area not better despite use of fungal cream and shampoo will refer to dermatology - mupirocin ointment (BACTROBAN) 2 %; Apply 1 application  topically 2 (two) times daily for 11 days.  Dispense: 22 g; Refill: 0 - doxycycline (VIBRAMYCIN) 50 MG capsule; Take 2 capsules (100 mg total) by mouth 2 (two) times daily for 7 days.  Dispense: 28 capsule; Refill: 0 - triamcinolone cream (KENALOG) 0.1 %; Apply 1 application  topically 2 (two) times daily.  Dispense: 30 g; Refill: 0 -Return to clinic if symptoms not better.    Note:  This document was prepared using Dragon voice recognition software and may include unintentional dictation errors. Note - This record has been created using [employer].  Chart creation errors have been sought, but may not always  have been located. Such creation errors do not reflect on  the standard of medical care.

## 2021-07-03 ENCOUNTER — Encounter: Payer: Self-pay | Admitting: Nurse Practitioner

## 2021-07-05 ENCOUNTER — Encounter (HOSPITAL_COMMUNITY): Payer: Self-pay | Admitting: Internal Medicine

## 2021-07-05 ENCOUNTER — Encounter: Payer: Self-pay | Admitting: Nurse Practitioner

## 2021-07-18 ENCOUNTER — Other Ambulatory Visit: Payer: Self-pay | Admitting: Family Medicine

## 2021-07-18 DIAGNOSIS — E876 Hypokalemia: Secondary | ICD-10-CM | POA: Diagnosis not present

## 2021-07-18 DIAGNOSIS — Z809 Family history of malignant neoplasm, unspecified: Secondary | ICD-10-CM | POA: Diagnosis not present

## 2021-07-18 DIAGNOSIS — I10 Essential (primary) hypertension: Secondary | ICD-10-CM | POA: Diagnosis not present

## 2021-07-18 DIAGNOSIS — Z720 Tobacco use: Secondary | ICD-10-CM | POA: Diagnosis not present

## 2021-07-18 DIAGNOSIS — M459 Ankylosing spondylitis of unspecified sites in spine: Secondary | ICD-10-CM | POA: Diagnosis not present

## 2021-07-18 DIAGNOSIS — D84821 Immunodeficiency due to drugs: Secondary | ICD-10-CM | POA: Diagnosis not present

## 2021-07-18 DIAGNOSIS — Z79620 Long term (current) use of immunosuppressive biologic: Secondary | ICD-10-CM | POA: Diagnosis not present

## 2021-07-19 ENCOUNTER — Encounter: Payer: Self-pay | Admitting: Family Medicine

## 2021-07-19 ENCOUNTER — Ambulatory Visit (INDEPENDENT_AMBULATORY_CARE_PROVIDER_SITE_OTHER): Payer: 59 | Admitting: Family Medicine

## 2021-07-19 VITALS — BP 152/83 | HR 57 | Temp 98.1°F | Ht 60.0 in | Wt 153.0 lb

## 2021-07-19 DIAGNOSIS — M549 Dorsalgia, unspecified: Secondary | ICD-10-CM | POA: Insufficient documentation

## 2021-07-19 MED ORDER — TRAMADOL HCL 50 MG PO TABS: 50.0000 mg | ORAL_TABLET | Freq: Three times a day (TID) | ORAL | 0 refills | Status: AC | PRN

## 2021-07-19 MED ORDER — TIZANIDINE HCL 4 MG PO TABS: 4.0000 mg | ORAL_TABLET | Freq: Three times a day (TID) | ORAL | 0 refills | Status: DC | PRN

## 2021-07-19 MED ORDER — VALACYCLOVIR HCL 1 G PO TABS: 1000.0000 mg | ORAL_TABLET | Freq: Three times a day (TID) | ORAL | 0 refills | Status: DC

## 2021-07-19 NOTE — Assessment & Plan Note (Signed)
It is possible that this is early shingles although there is no current rash.  Will place empirically on Valtrex.  However, I favor musculoskeletal cause.  Zanaflex and tramadol as directed.

## 2021-07-19 NOTE — Patient Instructions (Signed)
Rest.  Heat.  Medication as prescribed.  If persists, please let us know.  Take care  Dr. Lacinda Axon

## 2021-07-19 NOTE — Progress Notes (Signed)
Subjective:  Patient ID: Vanessa Rojas, female    DOB: Sep 19, 1965  Age: 56 y.o. MRN: [id number]  CC: Chief Complaint  Patient presents with   right side mid back pain    Piercing pain hurts to breathe or talk,started this morning, her daughter has had chicken pox all week , pt has not had shingle shot hurts to breathe or talk    HPI:  56 year old female with ankylosing spondylitis, tobacco abuse, history of TIA, hypertension presents for evaluation of the above.  Patient reports that her symptoms started abruptly this morning.  She states that she is having right-sided mid back pain.  No known inciting factor.  No appreciable rash.  She states that the pain is worse when she takes a deep breath and with certain movements.  No cough, shortness of breath, or respiratory symptoms.  No fever.  No relieving factors.  No other complaints or concerns at this time.  Patient Active Problem List   Diagnosis Date Noted   Mid back pain on right side 07/19/2021   Adenomatous colon polyp 02/22/2020   B12 deficiency 03/09/2019   Ankylosing spondylitis ([place]) 02/20/2019   Uveitis 07/18/2015   HLA B27 positive 07/18/2015   Cough variant asthma 05/18/2015   Gastroesophageal reflux disease without esophagitis 12/05/2014   TIA (transient ischemic attack) 04/08/2014   HTN (hypertension) 04/08/2014   Tobacco abuse 04/08/2014    Social Hx   Social History   Socioeconomic History   Marital status: Married    Spouse name: Not on file   Number of children: Not on file   Years of education: Not on file   Highest education level: Not on file  Occupational History   Not on file  Tobacco Use   Smoking status: Every Day    Packs/day: 0.75    Types: Cigarettes    Last attempt to quit: 11/15/2014    Years since quitting: 6.6   Smokeless tobacco: Never  Vaping Use   Vaping Use: Never used  Substance and Sexual Activity   Alcohol use: No    Alcohol/week: 0.0 standard drinks of alcohol   Drug  use: No   Sexual activity: Not on file  Other Topics Concern   Not on file  Social History Narrative   Not on file   Social Determinants of Health   Financial Resource Strain: Not on file  Food Insecurity: Not on file  Transportation Needs: Not on file  Physical Activity: Not on file  Stress: Not on file  Social Connections: Not on file    Review of Systems Per HPI  Objective:  BP (!) 152/83   Pulse (!) 57   Temp 98.1 F (36.7 C) (Oral)   Ht 5' (1.524 m)   Wt 153 lb (69.4 kg)   LMP 05/09/2012   SpO2 96%   BMI 29.88 kg/m      07/19/2021    1:59 PM 07/02/2021    3:47 PM 06/28/2021    1:05 PM  BP/Weight  Systolic BP 962 229 96  Diastolic BP 83 88 68  Wt. (Lbs) 153 155   BMI 29.88 kg/m2 30.27 kg/m2     Physical Exam Vitals and nursing note reviewed.  Constitutional:      Appearance: Normal appearance.  Eyes:     General:        Right eye: No discharge.        Left eye: No discharge.     Conjunctiva/sclera: Conjunctivae normal.  Cardiovascular:  Rate and Rhythm: Normal rate and regular rhythm.  Pulmonary:     Effort: Pulmonary effort is normal.     Breath sounds: Normal breath sounds. No wheezing, rhonchi or rales.  Musculoskeletal:     Comments: There is no appreciable rash in the area of concern.  Right-sided mid thoracic spine with mild tenderness to palpation  Neurological:     Mental Status: She is alert.  Psychiatric:        Mood and Affect: Mood normal.        Behavior: Behavior normal.     Lab Results  Component Value Date   WBC 11.8 (H) 04/08/2014   HGB 15.0 04/08/2014   HCT 44.0 04/08/2014   PLT 273 04/08/2014   GLUCOSE 103 (H) 06/26/2021   CHOL 189 02/22/2020   TRIG 188 (H) 02/22/2020   HDL 35 (L) 02/22/2020   LDLCALC 121 (H) 02/22/2020   ALT 20 02/22/2020   AST 14 02/22/2020   NA 138 06/26/2021   K 4.1 06/26/2021   CL 106 06/26/2021   CREATININE 0.93 06/26/2021   BUN 12 06/26/2021   CO2 28 06/26/2021   TSH 3.260 02/22/2020    INR 0.90 04/08/2014   HGBA1C 5.2 07/18/2015     Assessment & Plan:   Problem List Items Addressed This Visit       Other   Mid back pain on right side - Primary    It is possible that this is early shingles although there is no current rash.  Will place empirically on Valtrex.  However, I favor musculoskeletal cause.  Zanaflex and tramadol as directed.      Relevant Medications   tiZANidine (ZANAFLEX) 4 MG tablet   traMADol (ULTRAM) 50 MG tablet    Meds ordered this encounter  Medications   tiZANidine (ZANAFLEX) 4 MG tablet    Sig: Take 1 tablet (4 mg total) by mouth every 8 (eight) hours as needed (Back pain/spasm).    Dispense:  30 tablet    Refill:  0   traMADol (ULTRAM) 50 MG tablet    Sig: Take 1 tablet (50 mg total) by mouth every 8 (eight) hours as needed for up to 10 days for moderate pain or severe pain.    Dispense:  15 tablet    Refill:  0   valACYclovir (VALTREX) 1000 MG tablet    Sig: Take 1 tablet (1,000 mg total) by mouth 3 (three) times daily.    Dispense:  21 tablet    Refill:  [place]

## 2021-08-19 ENCOUNTER — Other Ambulatory Visit: Payer: Self-pay | Admitting: Family Medicine

## 2021-10-09 ENCOUNTER — Other Ambulatory Visit: Payer: Self-pay | Admitting: Family Medicine

## 2021-10-10 MED ORDER — METOPROLOL TARTRATE 100 MG PO TABS: ORAL_TABLET | ORAL | 1 refills | Status: DC

## 2021-10-10 MED ORDER — AMLODIPINE BESYLATE 2.5 MG PO TABS: 2.5000 mg | ORAL_TABLET | Freq: Every day | ORAL | 0 refills | Status: DC

## 2021-10-24 DIAGNOSIS — M79672 Pain in left foot: Secondary | ICD-10-CM | POA: Diagnosis not present

## 2021-10-24 DIAGNOSIS — Z79899 Other long term (current) drug therapy: Secondary | ICD-10-CM | POA: Diagnosis not present

## 2021-10-24 DIAGNOSIS — Z6829 Body mass index (BMI) 29.0-29.9, adult: Secondary | ICD-10-CM | POA: Diagnosis not present

## 2021-10-24 DIAGNOSIS — Z1589 Genetic susceptibility to other disease: Secondary | ICD-10-CM | POA: Diagnosis not present

## 2021-10-24 DIAGNOSIS — E663 Overweight: Secondary | ICD-10-CM | POA: Diagnosis not present

## 2021-10-24 DIAGNOSIS — M459 Ankylosing spondylitis of unspecified sites in spine: Secondary | ICD-10-CM | POA: Diagnosis not present

## 2021-10-24 DIAGNOSIS — H209 Unspecified iridocyclitis: Secondary | ICD-10-CM | POA: Diagnosis not present

## 2021-11-05 ENCOUNTER — Telehealth: Payer: Self-pay

## 2021-11-05 MED ORDER — AMLODIPINE BESYLATE 2.5 MG PO TABS: 2.5000 mg | ORAL_TABLET | Freq: Every day | ORAL | 2 refills | Status: DC

## 2021-11-05 NOTE — Telephone Encounter (Signed)
Encourage patient to contact the pharmacy for refills or they can request refills through [facility]  (Please schedule appointment if patient has not been seen in over a year)    WHAT PHARMACY WOULD THEY LIKE THIS SENT TO: [facility], [place], [place] [address]   MEDICATION NAME & DOSE:amLODipine ([place]) 2.5 MG tablet , metoprolol tartrate (LOPRESSOR) 100 MG tablet   NOTES/COMMENTS FROM PATIENT:Pt has Medication follow up Nov 2nd need one refill also needs yearly blood work ordered       [employer] office please notify patient: It takes 48-72 hours to process rx refill requests Ask patient to call pharmacy to ensure rx is ready before heading there.

## 2021-11-05 NOTE — Telephone Encounter (Signed)
Refill request appt in november

## 2021-11-11 ENCOUNTER — Other Ambulatory Visit: Payer: Self-pay

## 2021-11-11 MED ORDER — METOPROLOL TARTRATE 100 MG PO TABS: ORAL_TABLET | ORAL | 1 refills | Status: DC

## 2021-11-21 ENCOUNTER — Ambulatory Visit (INDEPENDENT_AMBULATORY_CARE_PROVIDER_SITE_OTHER): Payer: 59 | Admitting: Family Medicine

## 2021-11-21 ENCOUNTER — Encounter: Payer: Self-pay | Admitting: Family Medicine

## 2021-11-21 VITALS — BP 128/82 | HR 70 | Temp 98.0°F | Ht 60.0 in | Wt 153.0 lb

## 2021-11-21 DIAGNOSIS — I10 Essential (primary) hypertension: Secondary | ICD-10-CM | POA: Diagnosis not present

## 2021-11-21 DIAGNOSIS — Z122 Encounter for screening for malignant neoplasm of respiratory organs: Secondary | ICD-10-CM | POA: Diagnosis not present

## 2021-11-21 DIAGNOSIS — E785 Hyperlipidemia, unspecified: Secondary | ICD-10-CM

## 2021-11-21 MED ORDER — FAMOTIDINE 40 MG PO TABS: 40.0000 mg | ORAL_TABLET | Freq: Every day | ORAL | 5 refills | Status: DC

## 2021-11-21 MED ORDER — AMLODIPINE BESYLATE 2.5 MG PO TABS: 2.5000 mg | ORAL_TABLET | Freq: Every day | ORAL | 5 refills | Status: DC

## 2021-11-21 MED ORDER — POTASSIUM CHLORIDE ER 10 MEQ PO TBCR
EXTENDED_RELEASE_TABLET | ORAL | 5 refills | Status: DC
Start: 2021-11-21 — End: 2022-04-16

## 2021-11-21 MED ORDER — METOPROLOL TARTRATE 100 MG PO TABS: ORAL_TABLET | ORAL | 5 refills | Status: DC

## 2021-11-21 NOTE — Progress Notes (Signed)
   Subjective:    Patient ID: Vanessa Rojas, female    DOB: November 19, 1965, 56 y.o.   MRN: [id number]  Hypertension This is a chronic problem. Treatments tried: amlodipine , metoprolol.   Hyperlipidemia, unspecified hyperlipidemia type - Plan: Lipid Panel  Primary hypertension - Plan: Lipid Panel  Encounter for screening for lung cancer - Plan: Ambulatory referral to Pulmonology  We did discuss the importance of screening for lung cancer We also discussed healthy diet regular activity taking her medicine Patient for blood pressure check up.  The patient does have hypertension.   Patient relates dietary measures try to minimize salt The importance of healthy diet and activity were discussed Patient relates compliance  Patient was counseled to quit smoking unlikely she will do so in the near future   Review of Systems     Objective:   Physical Exam  General-in no acute distress Eyes-no discharge Lungs-respiratory rate normal, CTA CV-no murmurs,RRR Extremities skin warm dry no edema Neuro grossly normal Behavior normal, alert       Assessment & Plan:  1. Hyperlipidemia, unspecified hyperlipidemia type Check cholesterol profile - Lipid Panel  2. Primary hypertension Healthy diet minimize salt continue medication follow-up 6 months - Lipid Panel  3. Encounter for screening for lung cancer Referral she states she will do the test as long as it is covered by her insurance - Ambulatory referral to Pulmonology

## 2021-11-22 LAB — LIPID PANEL
Chol/HDL Ratio: 5.1 ratio — ABNORMAL HIGH (ref 0.0–4.4)
Cholesterol, Total: 220 mg/dL — ABNORMAL HIGH (ref 100–199)
HDL: 43 mg/dL (ref >39)
LDL Chol Calc (NIH): 145 mg/dL — ABNORMAL HIGH (ref 0–99)
Triglycerides: 177 mg/dL — ABNORMAL HIGH (ref 0–149)
VLDL Cholesterol Cal: 32 mg/dL (ref 5–40)

## 2021-11-26 ENCOUNTER — Other Ambulatory Visit: Payer: Self-pay

## 2021-11-26 MED ORDER — ROSUVASTATIN CALCIUM 10 MG PO TABS: 10.0000 mg | ORAL_TABLET | Freq: Every day | ORAL | 4 refills | Status: DC

## 2022-01-29 ENCOUNTER — Other Ambulatory Visit: Payer: Self-pay

## 2022-01-29 DIAGNOSIS — Z122 Encounter for screening for malignant neoplasm of respiratory organs: Secondary | ICD-10-CM

## 2022-01-29 DIAGNOSIS — F17210 Nicotine dependence, cigarettes, uncomplicated: Secondary | ICD-10-CM

## 2022-01-29 DIAGNOSIS — Z87891 Personal history of nicotine dependence: Secondary | ICD-10-CM

## 2022-03-14 ENCOUNTER — Encounter: Payer: Self-pay | Admitting: Acute Care

## 2022-03-18 ENCOUNTER — Encounter: Payer: Self-pay | Admitting: Acute Care

## 2022-03-18 ENCOUNTER — Ambulatory Visit (INDEPENDENT_AMBULATORY_CARE_PROVIDER_SITE_OTHER): Payer: 59 | Admitting: Acute Care

## 2022-03-18 DIAGNOSIS — F17210 Nicotine dependence, cigarettes, uncomplicated: Secondary | ICD-10-CM

## 2022-03-18 NOTE — Progress Notes (Signed)
Virtual Visit via Telephone Note  I connected with Vanessa Rojas on 03/18/22 at  4:00 PM EST by telephone and verified that I am speaking with the correct person using two identifiers.  Location: Patient:  At home Provider:  [place], [place], [place], [address]    I discussed the limitations, risks, security and privacy concerns of performing an evaluation and management service by telephone and the availability of in person appointments. I also discussed with the patient that there may be a patient responsible charge related to this service. The patient expressed understanding and agreed to proceed.   Shared Decision Making Visit Lung Cancer Screening Program [phone])   Eligibility: Age 57 y.o. Pack Years Smoking History Calculation 52 pack year smoking history (# packs/per year x # years smoked) Recent History of coughing up blood  no Unexplained weight loss? no ( >Than 15 pounds within the last 6 months ) Prior History Lung / other cancer no (Diagnosis within the last 5 years already requiring surveillance chest CT Scans). Smoking Status Current Smoker Former Smokers: Years since quit:  NA  Quit Date:  NA  Visit Components: Discussion included one or more decision making aids. yes Discussion included risk/benefits of screening. yes Discussion included potential follow up diagnostic testing for abnormal scans. yes Discussion included meaning and risk of over diagnosis. yes Discussion included meaning and risk of False Positives. yes Discussion included meaning of total radiation exposure. yes  Counseling Included: Importance of adherence to annual lung cancer LDCT screening. yes Impact of comorbidities on ability to participate in the program. yes Ability and willingness to under diagnostic treatment. yes  Smoking Cessation Counseling: Current Smokers:  Discussed importance of smoking cessation. yes Information about tobacco cessation classes and  interventions provided to patient. yes Patient provided with "ticket" for LDCT Scan. yes Symptomatic Patient. no  Counseling NA Diagnosis Code: Tobacco Use Z72.0 Asymptomatic Patient yes  Counseling (Intermediate counseling: > three minutes counseling) [id number] Former Smokers:  Discussed the importance of maintaining cigarette abstinence. yes Diagnosis Code: Personal History of Nicotine Dependence. [id number] Information about tobacco cessation classes and interventions provided to patient. Yes Patient provided with "ticket" for LDCT Scan. yes Written Order for Lung Cancer Screening with LDCT placed in Epic. Yes (CT Chest Lung Cancer Screening Low Dose W/O CM) [id number] Z12.2-Screening of respiratory organs Z87.891-Personal history of nicotine dependence  I have spent 25 minutes of face to face/ virtual visit   time with  Vanessa Rojas discussing the risks and benefits of lung cancer screening. We viewed / discussed a power point together that explained in detail the above noted topics. We paused at intervals to allow for questions to be asked and answered to ensure understanding.We discussed that the single most powerful action that she can take to decrease her risk of developing lung cancer is to quit smoking. We discussed whether or not she is ready to commit to setting a quit date. We discussed options for tools to aid in quitting smoking including nicotine replacement therapy, non-nicotine medications, support groups, Quit Smart classes, and behavior modification. We discussed that often times setting smaller, more achievable goals, such as eliminating 1 cigarette a day for a week and then 2 cigarettes a day for a week can be helpful in slowly decreasing the number of cigarettes smoked. This allows for a sense of accomplishment as well as providing a clinical benefit. I provided  her  with smoking cessation  information  with contact information for community resources,  classes, free nicotine replacement  therapy, and access to mobile apps, text messaging, and on-line smoking cessation help. I have also provided  her  the office contact information in the event he needs to contact me, or the screening staff. We discussed the time and location of the scan, and that either Vanessa Glassman RN, Vanessa Prince, RN  or I will call / send a letter with the results within 24-72 hours of receiving them. The patient verbalized understanding of all of  the above and had no further questions upon leaving the office. They have my contact information in the event they have any further questions.  I spent 3 minutes counseling on smoking cessation and the health risks of continued tobacco abuse.  I explained to the patient that there has been a high incidence of coronary artery disease noted on these exams. I explained that this is a non-gated exam therefore degree or severity cannot be determined. This patient is on statin therapy. I have asked the patient to follow-up with their PCP regarding any incidental finding of coronary artery disease and management with diet or medication as their PCP  feels is clinically indicated. The patient verbalized understanding of the above and had no further questions upon completion of the visit.     Magdalen Spatz, NP 03/18/2022

## 2022-03-18 NOTE — Patient Instructions (Signed)
Thank you for participating in the [facility] Scre[facility]. It was our pleasure to meet you today. We will call you with the results of your scan within the next few days. Your scan will be assigned a Lung RADS category score by the physicians reading the scans.  This Lung RADS score determines follow up scanning.  See below for description of categories, and follow up screening recommendations. We will be in touch to schedule your follow up screening annually or based on recommendations of our providers. We will fax a copy of your scan results to your Primary Care Physician, or the physician who referred you to the program, to ensure they have the results. Please call the office if you have any questions or concerns regarding your scanning experience or results.  Our office number is [phone]. Please speak with Denise Phelps, RN. , or  Denise Buckner RN, They are  our Lung Cancer Screening RN.'s If They are unavailable when you call, Please leave a message on the voice mail. We will return your call at our earliest convenience.This voice mail is monitored several times a day.  Remember, if your scan is normal, we will scan you annually as long as you continue to meet the criteria for the program. (Age 50-80, Current smoker or smoker who has quit within the last 15 years). If you are a smoker, remember, quitting is the single most powerful action that you can take to decrease your risk of lung cancer and other pulmonary, breathing related problems. We know quitting is hard, and we are here to help.  Please let us know if there is anything we can do to help you meet your goal of quitting. If you are a former smoker, congratulations. We are proud of you! Remain smoke free! Remember you can refer friends or family members through the number above.  We will screen them to make sure they meet criteria for the program. Thank you for helping us take better care of you by  participating in Lung Screening.  You can receive free nicotine replacement therapy ( patches, gum or mints) by calling 1-800-QUIT NOW. Please call so we can get you on the path to becoming  a non-smoker. I know it is hard, but you can do this!  Lung RADS Categories:  Lung RADS 1: no nodules or definitely non-concerning nodules.  Recommendation is for a repeat annual scan in 12 months.  Lung RADS 2:  nodules that are non-concerning in appearance and behavior with a very low likelihood of becoming an active cancer. Recommendation is for a repeat annual scan in 12 months.  Lung RADS 3: nodules that are probably non-concerning , includes nodules with a low likelihood of becoming an active cancer.  Recommendation is for a 6-month repeat screening scan. Often noted after an upper respiratory illness. We will be in touch to make sure you have no questions, and to schedule your 6-month scan.  Lung RADS 4 A: nodules with concerning findings, recommendation is most often for a follow up scan in 3 months or additional testing based on our provider's assessment of the scan. We will be in touch to make sure you have no questions and to schedule the recommended 3 month follow up scan.  Lung RADS 4 B:  indicates findings that are concerning. We will be in touch with you to schedule additional diagnostic testing based on our provider's  assessment of the scan.  Other options for assistance in smoking cessation (   As covered by your insurance benefits)  Hypnosis for smoking cessation  [employer] [phone]  Acupuncture for smoking cessation  [facility] [phone]   

## 2022-03-19 ENCOUNTER — Ambulatory Visit (HOSPITAL_COMMUNITY): Payer: 59

## 2022-03-20 ENCOUNTER — Ambulatory Visit
Admission: RE | Admit: 2022-03-20 | Discharge: 2022-03-20 | Disposition: A | Discharge status: Home / Self Care | Payer: 59 | Source: Ambulatory Visit | Attending: Acute Care | Admitting: Acute Care

## 2022-03-20 DIAGNOSIS — J439 Emphysema, unspecified: Secondary | ICD-10-CM | POA: Diagnosis not present

## 2022-03-20 DIAGNOSIS — Z122 Encounter for screening for malignant neoplasm of respiratory organs: Secondary | ICD-10-CM

## 2022-03-20 DIAGNOSIS — Z87891 Personal history of nicotine dependence: Secondary | ICD-10-CM | POA: Diagnosis not present

## 2022-03-20 DIAGNOSIS — J8410 Pulmonary fibrosis, unspecified: Secondary | ICD-10-CM | POA: Diagnosis not present

## 2022-03-20 DIAGNOSIS — I2510 Atherosclerotic heart disease of native coronary artery without angina pectoris: Secondary | ICD-10-CM | POA: Diagnosis not present

## 2022-03-20 DIAGNOSIS — F17210 Nicotine dependence, cigarettes, uncomplicated: Secondary | ICD-10-CM

## 2022-03-24 ENCOUNTER — Other Ambulatory Visit: Payer: Self-pay | Admitting: Acute Care

## 2022-03-24 DIAGNOSIS — Z87891 Personal history of nicotine dependence: Secondary | ICD-10-CM

## 2022-03-24 DIAGNOSIS — F17210 Nicotine dependence, cigarettes, uncomplicated: Secondary | ICD-10-CM

## 2022-03-24 DIAGNOSIS — Z122 Encounter for screening for malignant neoplasm of respiratory organs: Secondary | ICD-10-CM

## 2022-03-25 ENCOUNTER — Other Ambulatory Visit: Payer: Self-pay | Admitting: Family Medicine

## 2022-03-25 DIAGNOSIS — Z1231 Encounter for screening mammogram for malignant neoplasm of breast: Secondary | ICD-10-CM

## 2022-03-26 ENCOUNTER — Telehealth: Payer: Self-pay | Admitting: Family Medicine

## 2022-03-26 DIAGNOSIS — E785 Hyperlipidemia, unspecified: Secondary | ICD-10-CM

## 2022-03-26 DIAGNOSIS — I10 Essential (primary) hypertension: Secondary | ICD-10-CM

## 2022-03-26 NOTE — Telephone Encounter (Signed)
Nurses Please let the patient know that I did review over her lung cancer screening CAT scan.  I believe she already knows the results.  There is no sign of tumor.  But did show aortic atherosclerosis all the more reason to make sure her cholesterol is in good order.  Her most previous cholesterol was mildly elevated.  Please make sure she is taking her statin.  Also she should do lipid, liver, metabolic 7, urine ACR before her follow-up visit she needs to schedule follow-up visit late April or early May Hyperlipidemia, hypertension diagnosis thank you  Very important to go ahead and schedule because everything looks up early

## 2022-03-27 NOTE — Telephone Encounter (Signed)
Patient advised per Dr Nicki Reaper Please let the patient know that I did review over her lung cancer screening CAT scan.  I believe she already knows the results.  There is no sign of tumor.  But did show aortic atherosclerosis all the more reason to make sure her cholesterol is in good order.  Her most previous cholesterol was mildly elevated.  Please make sure she is taking her statin.   Also she should do lipid, liver, metabolic 7, urine ACR before her follow-up visit she needs to schedule follow-up visit late April or early May Hyperlipidemia, hypertension diagnosis thank you   Very important to go ahead and schedule because everything books up early   Patient verbalized understanding, front desk scheduled appointment and labs placed per request.

## 2022-04-14 ENCOUNTER — Other Ambulatory Visit: Payer: Self-pay | Admitting: Family Medicine

## 2022-04-29 DIAGNOSIS — R5383 Other fatigue: Secondary | ICD-10-CM | POA: Diagnosis not present

## 2022-04-29 DIAGNOSIS — Z6829 Body mass index (BMI) 29.0-29.9, adult: Secondary | ICD-10-CM | POA: Diagnosis not present

## 2022-04-29 DIAGNOSIS — M1991 Primary osteoarthritis, unspecified site: Secondary | ICD-10-CM | POA: Diagnosis not present

## 2022-04-29 DIAGNOSIS — H209 Unspecified iridocyclitis: Secondary | ICD-10-CM | POA: Diagnosis not present

## 2022-04-29 DIAGNOSIS — E663 Overweight: Secondary | ICD-10-CM | POA: Diagnosis not present

## 2022-04-29 DIAGNOSIS — Z79899 Other long term (current) drug therapy: Secondary | ICD-10-CM | POA: Diagnosis not present

## 2022-04-29 DIAGNOSIS — Z1589 Genetic susceptibility to other disease: Secondary | ICD-10-CM | POA: Diagnosis not present

## 2022-04-29 DIAGNOSIS — M459 Ankylosing spondylitis of unspecified sites in spine: Secondary | ICD-10-CM | POA: Diagnosis not present

## 2022-05-11 ENCOUNTER — Other Ambulatory Visit: Payer: Self-pay | Admitting: Family Medicine

## 2022-05-22 ENCOUNTER — Ambulatory Visit: Payer: 59 | Admitting: Family Medicine

## 2022-05-23 ENCOUNTER — Other Ambulatory Visit: Payer: Self-pay | Admitting: Family Medicine

## 2022-05-27 DIAGNOSIS — E785 Hyperlipidemia, unspecified: Secondary | ICD-10-CM | POA: Diagnosis not present

## 2022-05-27 DIAGNOSIS — I10 Essential (primary) hypertension: Secondary | ICD-10-CM | POA: Diagnosis not present

## 2022-05-28 LAB — HEPATIC FUNCTION PANEL
ALT: 19 IU/L (ref 0–32)
AST: 18 IU/L (ref 0–40)
Albumin: 4.4 g/dL (ref 3.8–4.9)
Alkaline Phosphatase: 111 IU/L (ref 44–121)
Bilirubin Total: 0.3 mg/dL (ref 0.0–1.2)
Bilirubin, Direct: 0.1 mg/dL (ref 0.00–0.40)
Total Protein: 6.7 g/dL (ref 6.0–8.5)

## 2022-05-28 LAB — LIPID PANEL
Chol/HDL Ratio: 2.7 ratio (ref 0.0–4.4)
Cholesterol, Total: 118 mg/dL (ref 100–199)
HDL: 43 mg/dL (ref >39)
LDL Chol Calc (NIH): 54 mg/dL (ref 0–99)
Triglycerides: 113 mg/dL (ref 0–149)
VLDL Cholesterol Cal: 21 mg/dL (ref 5–40)

## 2022-05-28 LAB — MICROALBUMIN / CREATININE URINE RATIO
Creatinine, Urine: 134.3 mg/dL
Microalb/Creat Ratio: 55 mg/g creat — ABNORMAL HIGH (ref 0–29)
Microalbumin, Urine: 74.1 ug/mL

## 2022-05-28 LAB — BASIC METABOLIC PANEL (7)
BUN/Creatinine Ratio: 14 (ref 9–23)
BUN: 12 mg/dL (ref 6–24)
CO2: 23 mmol/L (ref 20–29)
Chloride: 103 mmol/L (ref 96–106)
Creatinine, Ser: 0.86 mg/dL (ref 0.57–1.00)
Glucose: 102 mg/dL — ABNORMAL HIGH (ref 70–99)
Potassium: 4.5 mmol/L (ref 3.5–5.2)
Sodium: 142 mmol/L (ref 134–144)
eGFR: 78 mL/min/{1.73_m2} (ref >59)

## 2022-05-29 ENCOUNTER — Ambulatory Visit (INDEPENDENT_AMBULATORY_CARE_PROVIDER_SITE_OTHER): Payer: 59 | Admitting: Family Medicine

## 2022-05-29 ENCOUNTER — Telehealth: Payer: Self-pay

## 2022-05-29 VITALS — BP 136/88 | HR 55 | Ht 60.0 in | Wt 151.2 lb

## 2022-05-29 DIAGNOSIS — Z720 Tobacco use: Secondary | ICD-10-CM | POA: Diagnosis not present

## 2022-05-29 DIAGNOSIS — E785 Hyperlipidemia, unspecified: Secondary | ICD-10-CM | POA: Diagnosis not present

## 2022-05-29 DIAGNOSIS — I10 Essential (primary) hypertension: Secondary | ICD-10-CM

## 2022-05-29 DIAGNOSIS — M79604 Pain in right leg: Secondary | ICD-10-CM

## 2022-05-29 DIAGNOSIS — J439 Emphysema, unspecified: Secondary | ICD-10-CM

## 2022-05-29 DIAGNOSIS — I700 Atherosclerosis of aorta: Secondary | ICD-10-CM

## 2022-05-29 DIAGNOSIS — R6889 Other general symptoms and signs: Secondary | ICD-10-CM | POA: Diagnosis not present

## 2022-05-29 MED ORDER — ROSUVASTATIN CALCIUM 10 MG PO TABS: 10.0000 mg | ORAL_TABLET | Freq: Every day | ORAL | 1 refills | Status: DC

## 2022-05-29 MED ORDER — AMLODIPINE BESYLATE 2.5 MG PO TABS: 2.5000 mg | ORAL_TABLET | Freq: Every day | ORAL | 1 refills | Status: DC

## 2022-05-29 MED ORDER — FAMOTIDINE 40 MG PO TABS: 40.0000 mg | ORAL_TABLET | Freq: Every day | ORAL | 1 refills | Status: DC

## 2022-05-29 MED ORDER — METOPROLOL TARTRATE 100 MG PO TABS: ORAL_TABLET | ORAL | 1 refills | Status: DC

## 2022-05-29 NOTE — Telephone Encounter (Signed)
Called and left messaged to call office to inform patient that [place] appointment has been made. MyChart patient has been sent to patient with appointment information.

## 2022-05-29 NOTE — Progress Notes (Signed)
Subjective:    Patient ID: Vanessa Rojas, female    DOB: 1965/06/30, 57 y.o.   MRN: [id number]  HPI Patient arrives today for 6 month follow up. Patient states no concerns or issues today.   Hyperlipidemia, unspecified hyperlipidemia type - Plan: Lipid panel  Primary hypertension - Plan: Basic Metabolic Panel (7), Microalbumin/Creatinine Ratio, Urine  Aortic atherosclerosis (HCC)  Pain of right lower extremity - Plan: US ARTERIAL ABI (SCREENING LOWER EXTREMITY)  Still smokes no she needs to quit we discussed the importance of quitting in order to really reduce the risk of progression of her chronic health issues Results for orders placed or performed in visit on 03/26/22  Lipid panel  Result Value Ref Range   Cholesterol, Total 118 100 - 199 mg/dL   Triglycerides 829 0 - 149 mg/dL   HDL 43 >56 mg/dL   VLDL Cholesterol Cal 21 5 - 40 mg/dL   LDL Chol Calc (NIH) 54 0 - 99 mg/dL   Chol/HDL Ratio 2.7 0.0 - 4.4 ratio  Hepatic Function Panel  Result Value Ref Range   Total Protein 6.7 6.0 - 8.5 g/dL   Albumin 4.4 3.8 - 4.9 g/dL   Bilirubin Total 0.3 0.0 - 1.2 mg/dL   Bilirubin, Direct 2.13 0.00 - 0.40 mg/dL   Alkaline Phosphatase 111 44 - 121 IU/L   AST 18 0 - 40 IU/L   ALT 19 0 - 32 IU/L  Basic Metabolic Panel (7)  Result Value Ref Range   Glucose 102 (H) 70 - 99 mg/dL   BUN 12 6 - 24 mg/dL   Creatinine, Ser 0.86 0.57 - 1.00 mg/dL   eGFR 78 >57 QI/ONG/2.95   BUN/Creatinine Ratio 14 9 - 23   Sodium 142 134 - 144 mmol/L   Potassium 4.5 3.5 - 5.2 mmol/L   Chloride 103 96 - 106 mmol/L   CO2 23 20 - 29 mmol/L  Microalbumin/Creatinine Ratio, Urine  Result Value Ref Range   Creatinine, Urine 134.3 Not Estab. mg/dL   Microalbumin, Urine 28.4 Not Estab. ug/mL   Microalb/Creat Ratio 55 (H) 0 - 29 mg/g creat    Outpatient Encounter Medications as of 05/29/2022  Medication Sig   amLODipine (NORVASC) 2.5 MG tablet Take 1 tablet (2.5 mg total) by mouth daily.   cyanocobalamin  1000 MCG tablet Take 1,000 mcg by mouth daily.   famotidine (PEPCID) 40 MG tablet TAKE ONE TABLET BY MOUTH EVERY DAY   HUMIRA PEN 40 MG/0.4ML PNKT Inject 40 mg into the skin every 14 (fourteen) days.    metoprolol tartrate (LOPRESSOR) 100 MG tablet TAKE 1/2 TABLET BY MOUTH TWICE DAILY   potassium chloride (KLOR-CON) 10 MEQ tablet TAKE TWO TABLETS BY MOUTH TWICE DAILY   rosuvastatin (CRESTOR) 10 MG tablet TAKE ONE TABLET BY MOUTH ONCE DAILY   No facility-administered encounter medications on file as of 05/29/2022.     Review of Systems     Objective:   Physical Exam General-in no acute distress Eyes-no discharge Lungs-respiratory rate normal, CTA CV-no murmurs,RRR Extremities skin warm dry no edema Neuro grossly normal Behavior normal, alert        Assessment & Plan:  1. Hyperlipidemia, unspecified hyperlipidemia type Healthy diet continue medication watch diet closely - Lipid panel  2. Primary hypertension Blood pressure good control Check urine micro protein next visit If it is still elevated switch over to ARB - Basic Metabolic Panel (7) - Microalbumin/Creatinine Ratio, Urine  3. Aortic atherosclerosis (HCC) Quit smoking, continue medication,  healthy diet,  4. Pain of right lower extremity Diminished pulses in the right leg need to rule out claudication If this is normal may need consultation with physiatrist - US ARTERIAL ABI (SCREENING LOWER EXTREMITY)  5. Pulmonary emphysema, unspecified emphysema type (HCC) Patient encouraged to quit smoking she denies shortness of breath this was seen on CAT scan  6. Tobacco use Patient encouraged to quit smoking

## 2022-06-03 ENCOUNTER — Ambulatory Visit (HOSPITAL_COMMUNITY)
Admission: RE | Admit: 2022-06-03 | Discharge: 2022-06-03 | Disposition: A | Discharge status: Home / Self Care | Payer: 59 | Source: Ambulatory Visit | Attending: Family Medicine | Admitting: Family Medicine

## 2022-06-03 DIAGNOSIS — M79604 Pain in right leg: Secondary | ICD-10-CM | POA: Diagnosis not present

## 2022-06-10 NOTE — Addendum Note (Signed)
Addended by: Margaretha Sheffield on: 06/10/2022 04:20 PM   Modules accepted: Orders

## 2022-06-18 ENCOUNTER — Ambulatory Visit (INDEPENDENT_AMBULATORY_CARE_PROVIDER_SITE_OTHER): Payer: 59

## 2022-06-18 ENCOUNTER — Encounter: Payer: Self-pay | Admitting: Vascular Surgery

## 2022-06-18 ENCOUNTER — Ambulatory Visit: Payer: 59 | Admitting: Vascular Surgery

## 2022-06-18 ENCOUNTER — Other Ambulatory Visit: Payer: Self-pay

## 2022-06-18 VITALS — BP 155/89 | HR 48 | Temp 97.3°F | Ht 60.0 in | Wt 150.6 lb

## 2022-06-18 DIAGNOSIS — I70211 Atherosclerosis of native arteries of extremities with intermittent claudication, right leg: Secondary | ICD-10-CM | POA: Diagnosis not present

## 2022-06-18 DIAGNOSIS — M79604 Pain in right leg: Secondary | ICD-10-CM

## 2022-06-18 LAB — VAS US ABI WITH/WO TBI
Left ABI: 1.04
Right ABI: 0.82

## 2022-06-18 NOTE — H&P (View-Only) (Signed)
  V[facility] Specialist of   Patient name: Gladyse K Baquera MRN: [id number] DOB: 12/30/1965 Sex: female  REASON FOR CONSULT: Evaluation intermittent claudication right leg  HPI: Vanessa Rojas is a 57 y.o. female, who is here today for evaluation.  She is here with her husband.  She reports 6-month history of total claudication in her right leg.  She reports that she develops a tired feeling with walking and this is relieved with rest.  She does not have any history of tissue loss.  She does not have any symptoms in her left leg.  She is a long time cigarette smoker.  She does not have any cardiac history.  Past Medical History:  Diagnosis Date   Ankylosing spondylitis (HCC) 02/20/2019   Followed by rheumatologist Dr. Angela Hawkes [facility], Humira, January 2021   B12 deficiency 03/09/2019   B12 level 291 replenish with shots then oral medication February 2021   Hypertension    Hypokalemia 05/24/2019    Family History  Problem Relation Age of Onset   Cancer Mother        lung, liver, breast   Cancer Father    Hyperlipidemia Father    Hypertension Father    Cancer Brother        colon   Diabetes Maternal Aunt    Diabetes Maternal Grandmother     SOCIAL HISTORY: Social History   Socioeconomic History   Marital status: Married    Spouse name: Not on file   Number of children: Not on file   Years of education: Not on file   Highest education level: Not on file  Occupational History   Not on file  Tobacco Use   Smoking status: Every Day    Packs/day: 0.75    Years: 35.00    Additional pack years: 0.00    Total pack years: 26.25    Types: Cigarettes    Last attempt to quit: 11/15/2014    Years since quitting: 7.5   Smokeless tobacco: Never  Vaping Use   Vaping Use: Never used  Substance and Sexual Activity   Alcohol use: No    Alcohol/week: 0.0 standard drinks of alcohol   Drug use: No   Sexual  activity: Not on file  Other Topics Concern   Not on file  Social History Narrative   Not on file   Social Determinants of Health   Financial Resource Strain: Not on file  Food Insecurity: Not on file  Transportation Needs: Not on file  Physical Activity: Not on file  Stress: Not on file  Social Connections: Not on file  Intimate Partner Violence: Not on file    Allergies  Allergen Reactions   Penicillins Rash    Childhood allergy.   Wellbutrin [Bupropion] Nausea And Vomiting    Current Outpatient Medications  Medication Sig Dispense Refill   amLODipine (NORVASC) 2.5 MG tablet Take 1 tablet (2.5 mg total) by mouth daily. 90 tablet 1   cyanocobalamin 1000 MCG tablet Take 1,000 mcg by mouth daily.     famotidine (PEPCID) 40 MG tablet Take 1 tablet (40 mg total) by mouth daily. 90 tablet 1   HUMIRA PEN 40 MG/0.4ML PNKT Inject 40 mg into the skin every 14 (fourteen) days.      metoprolol tartrate (LOPRESSOR) 100 MG tablet TAKE 1/2 TABLET BY MOUTH TWICE DAILY 90 tablet 1   pantoprazole (PROTONIX) 40 MG tablet Take 40 mg by mouth daily.     potassium chloride (  KLOR-CON) 10 MEQ tablet TAKE TWO TABLETS BY MOUTH TWICE DAILY 120 tablet 0   rosuvastatin (CRESTOR) 10 MG tablet Take 1 tablet (10 mg total) by mouth daily. 90 tablet 1   No current facility-administered medications for this visit.    REVIEW OF SYSTEMS:  [X] denotes positive finding, [ ] denotes negative finding Cardiac  Comments:  Chest pain or chest pressure:    Shortness of breath upon exertion:    Short of breath when lying flat:    Irregular heart rhythm:        Vascular    Pain in calf, thigh, or hip brought on by ambulation: x   Pain in feet at night that wakes you up from your sleep:     Blood clot in your veins:    Leg swelling:         Pulmonary    Oxygen at home:    Productive cough:     Wheezing:         Neurologic    Sudden weakness in arms or legs:     Sudden numbness in arms or legs:      Sudden onset of difficulty speaking or slurred speech:    Temporary loss of vision in one eye:     Problems with dizziness:         Gastrointestinal    Blood in stool:     Vomited blood:         Genitourinary    Burning when urinating:     Blood in urine:        Psychiatric    Major depression:         Hematologic    Bleeding problems:    Problems with blood clotting too easily:        Skin    Rashes or ulcers:        Constitutional    Fever or chills:      PHYSICAL EXAM: Vitals:   06/18/22 1418  BP: (!) 155/89  Pulse: (!) 48  Temp: (!) 97.3 F (36.3 C)  SpO2: 97%  Weight: 150 lb 9.6 oz (68.3 kg)  Height: 5' (1.524 m)    GENERAL: The patient is a well-nourished female, in no acute distress. The vital signs are documented above. CARDIOVASCULAR: I do not palpate a femoral pulse on the right.  She does have a palpable left femoral pulse and palpable 2+ dorsalis pedis pulse on the left.  She has a faint dorsalis pedis pulse on the right PULMONARY: There is good air exchange  MUSCULOSKELETAL: There are no major deformities or cyanosis. NEUROLOGIC: No focal weakness or paresthesias are detected. SKIN: There are no ulcers or rashes noted. PSYCHIATRIC: The patient has a normal affect.  DATA:  Noninvasive studies in our office today reveal ankle arm index of 0.8 with monophasic flow on the right and normal greater than 1.0 ABI on the left with triphasic flow  MEDICAL ISSUES: Limiting intermittent claudication related to probable right iliac disease.  I explained that this is not limb threatening.  I did explain that in all likelihood this could be aided with endovascular means with good long-term durability.  She reports that this is very limiting to her and she is not comfortable with this at her young age and activity.  We will schedule her for outpatient arteriography and potential right iliac intervention at [facility].  She understands that one of my partners  will be doing the procedure.  I   did explain the direct relationship with cigarette smoking and her peripheral vascular occlusive disease.  Also explained that she would have a much better long-term durability from any procedure and decrease risk of progression if she is able to quit smoking   Lachlan Pelto F. Patrik Turnbaugh, MD FACS [facility] [place] Office Tel [phone] Pager [phone]  Note: Portions of this report may have been transcribed using voice recognition software.  Every effort has been made to ensure accuracy; however, inadvertent computerized transcription errors may still be present.  

## 2022-06-18 NOTE — Progress Notes (Signed)
Vascular and Vein Specialist of [place]  Patient name: Vanessa Rojas MRN: [id number] DOB: 1965/10/08 Sex: female  REASON FOR CONSULT: Evaluation intermittent claudication right leg  HPI: Vanessa Rojas is a 57 y.o. female, who is here today for evaluation.  She is here with her husband.  She reports 40-month history of total claudication in her right leg.  She reports that she develops a tired feeling with walking and this is relieved with rest.  She does not have any history of tissue loss.  She does not have any symptoms in her left leg.  She is a long time cigarette smoker.  She does not have any cardiac history.  Past Medical History:  Diagnosis Date   Ankylosing spondylitis (HCC) 02/20/2019   Followed by rheumatologist Dr. Zenovia Jordan [facility] rheumatology, Humira, January 2021   B12 deficiency 03/09/2019   B12 level 291 replenish with shots then oral medication February 2021   Hypertension    Hypokalemia 05/24/2019    Family History  Problem Relation Age of Onset   Cancer Mother        lung, liver, breast   Cancer Father    Hyperlipidemia Father    Hypertension Father    Cancer Brother        colon   Diabetes Maternal Aunt    Diabetes Maternal Grandmother     SOCIAL HISTORY: Social History   Socioeconomic History   Marital status: Married    Spouse name: Not on file   Number of children: Not on file   Years of education: Not on file   Highest education level: Not on file  Occupational History   Not on file  Tobacco Use   Smoking status: Every Day    Packs/day: 0.75    Years: 35.00    Additional pack years: 0.00    Total pack years: 26.25    Types: Cigarettes    Last attempt to quit: 11/15/2014    Years since quitting: 7.5   Smokeless tobacco: Never  Vaping Use   Vaping Use: Never used  Substance and Sexual Activity   Alcohol use: No    Alcohol/week: 0.0 standard drinks of alcohol   Drug use: No   Sexual  activity: Not on file  Other Topics Concern   Not on file  Social History Narrative   Not on file   Social Determinants of Health   Financial Resource Strain: Not on file  Food Insecurity: Not on file  Transportation Needs: Not on file  Physical Activity: Not on file  Stress: Not on file  Social Connections: Not on file  Intimate Partner Violence: Not on file    Allergies  Allergen Reactions   Penicillins Rash    Childhood allergy.   Wellbutrin [Bupropion] Nausea And Vomiting    Current Outpatient Medications  Medication Sig Dispense Refill   amLODipine (NORVASC) 2.5 MG tablet Take 1 tablet (2.5 mg total) by mouth daily. 90 tablet 1   cyanocobalamin 1000 MCG tablet Take 1,000 mcg by mouth daily.     famotidine (PEPCID) 40 MG tablet Take 1 tablet (40 mg total) by mouth daily. 90 tablet 1   HUMIRA PEN 40 MG/0.4ML PNKT Inject 40 mg into the skin every 14 (fourteen) days.      metoprolol tartrate (LOPRESSOR) 100 MG tablet TAKE 1/2 TABLET BY MOUTH TWICE DAILY 90 tablet 1   pantoprazole (PROTONIX) 40 MG tablet Take 40 mg by mouth daily.     potassium chloride (  KLOR-CON) 10 MEQ tablet TAKE TWO TABLETS BY MOUTH TWICE DAILY 120 tablet 0   rosuvastatin (CRESTOR) 10 MG tablet Take 1 tablet (10 mg total) by mouth daily. 90 tablet 1   No current facility-administered medications for this visit.    REVIEW OF SYSTEMS:  [X]  denotes positive finding, [ ]  denotes negative finding Cardiac  Comments:  Chest pain or chest pressure:    Shortness of breath upon exertion:    Short of breath when lying flat:    Irregular heart rhythm:        Vascular    Pain in calf, thigh, or hip brought on by ambulation: x   Pain in feet at night that wakes you up from your sleep:     Blood clot in your veins:    Leg swelling:         Pulmonary    Oxygen at home:    Productive cough:     Wheezing:         Neurologic    Sudden weakness in arms or legs:     Sudden numbness in arms or legs:      Sudden onset of difficulty speaking or slurred speech:    Temporary loss of vision in one eye:     Problems with dizziness:         Gastrointestinal    Blood in stool:     Vomited blood:         Genitourinary    Burning when urinating:     Blood in urine:        Psychiatric    Major depression:         Hematologic    Bleeding problems:    Problems with blood clotting too easily:        Skin    Rashes or ulcers:        Constitutional    Fever or chills:      PHYSICAL EXAM: Vitals:   06/18/22 1418  BP: (!) 155/89  Pulse: (!) 48  Temp: (!) 97.3 F (36.3 C)  SpO2: 97%  Weight: 150 lb 9.6 oz (68.3 kg)  Height: 5' (1.524 m)    GENERAL: The patient is a well-nourished female, in no acute distress. The vital signs are documented above. CARDIOVASCULAR: I do not palpate a femoral pulse on the right.  She does have a palpable left femoral pulse and palpable 2+ dorsalis pedis pulse on the left.  She has a faint dorsalis pedis pulse on the right PULMONARY: There is good air exchange  MUSCULOSKELETAL: There are no major deformities or cyanosis. NEUROLOGIC: No focal weakness or paresthesias are detected. SKIN: There are no ulcers or rashes noted. PSYCHIATRIC: The patient has a normal affect.  DATA:  Noninvasive studies in our office today reveal ankle arm index of 0.8 with monophasic flow on the right and normal greater than 1.0 ABI on the left with triphasic flow  MEDICAL ISSUES: Limiting intermittent claudication related to probable right iliac disease.  I explained that this is not limb threatening.  I did explain that in all likelihood this could be aided with endovascular means with good long-term durability.  She reports that this is very limiting to her and she is not comfortable with this at her young age and activity.  We will schedule her for outpatient arteriography and potential right iliac intervention at [facility].  She understands that one of my partners  will be doing the procedure.  I  did explain the direct relationship with cigarette smoking and her peripheral vascular occlusive disease.  Also explained that she would have a much better long-term durability from any procedure and decrease risk of progression if she is able to quit smoking   Larina Earthly, MD [facility] Specialists of [facility] Tel [phone] Pager [phone]  Note: Portions of this report may have been transcribed using voice recognition software.  Every effort has been made to ensure accuracy; however, inadvertent computerized transcription errors may still be present.

## 2022-06-23 ENCOUNTER — Telehealth: Payer: Self-pay

## 2022-06-23 ENCOUNTER — Other Ambulatory Visit: Payer: Self-pay | Admitting: Family Medicine

## 2022-06-23 ENCOUNTER — Other Ambulatory Visit: Payer: Self-pay

## 2022-06-23 DIAGNOSIS — I70211 Atherosclerosis of native arteries of extremities with intermittent claudication, right leg: Secondary | ICD-10-CM

## 2022-06-23 NOTE — Telephone Encounter (Signed)
Patient returned call. Procedure scheduled for 6/11 with instructions provided. She voiced understanding.

## 2022-06-23 NOTE — Telephone Encounter (Signed)
Attempted to reach patient to schedule aortogram, but no answer. Left VM to return call.  

## 2022-07-01 ENCOUNTER — Encounter (HOSPITAL_COMMUNITY)
Admission: RE | Disposition: A | Discharge status: Home / Self Care | Payer: Self-pay | Source: Home / Self Care | Attending: Surgery

## 2022-07-01 ENCOUNTER — Ambulatory Visit (HOSPITAL_COMMUNITY)
Admission: RE | Admit: 2022-07-01 | Discharge: 2022-07-01 | Disposition: A | Discharge status: Home / Self Care | Payer: 59 | Attending: Surgery | Admitting: Surgery

## 2022-07-01 DIAGNOSIS — I70211 Atherosclerosis of native arteries of extremities with intermittent claudication, right leg: Secondary | ICD-10-CM | POA: Diagnosis not present

## 2022-07-01 DIAGNOSIS — F17210 Nicotine dependence, cigarettes, uncomplicated: Secondary | ICD-10-CM | POA: Insufficient documentation

## 2022-07-01 HISTORY — PX: ABDOMINAL AORTOGRAM W/LOWER EXTREMITY: CATH118223

## 2022-07-01 HISTORY — PX: PERIPHERAL VASCULAR INTERVENTION: CATH118257

## 2022-07-01 LAB — POCT I-STAT, CHEM 8
BUN: 9 mg/dL (ref 6–20)
Calcium, Ion: 1.24 mmol/L (ref 1.15–1.40)
Chloride: 104 mmol/L (ref 98–111)
Creatinine, Ser: 0.8 mg/dL (ref 0.44–1.00)
Glucose, Bld: 94 mg/dL (ref 70–99)
HCT: 41 % (ref 36.0–46.0)
Hemoglobin: 13.9 g/dL (ref 12.0–15.0)
Potassium: 4.1 mmol/L (ref 3.5–5.1)
Sodium: 142 mmol/L (ref 135–145)
TCO2: 28 mmol/L (ref 22–32)

## 2022-07-01 LAB — POCT ACTIVATED CLOTTING TIME: Activated Clotting Time: 262 seconds

## 2022-07-01 SURGERY — ABDOMINAL AORTOGRAM W/LOWER EXTREMITY
Anesthesia: LOCAL | Laterality: Right

## 2022-07-01 MED ORDER — LIDOCAINE HCL (PF) 1 % IJ SOLN
INTRAMUSCULAR | Status: DC | PRN
  Administered 2022-07-01: 15 mL

## 2022-07-01 MED ORDER — ASPIRIN 81 MG PO CHEW
CHEWABLE_TABLET | ORAL | Status: AC
  Filled 2022-07-01: qty 1

## 2022-07-01 MED ORDER — CLOPIDOGREL BISULFATE 75 MG PO TABS: 300.0000 mg | ORAL_TABLET | Freq: Once | ORAL | Status: DC

## 2022-07-01 MED ORDER — MORPHINE SULFATE (PF) 2 MG/ML IV SOLN: 2.0000 mg | INTRAVENOUS | Status: DC | PRN

## 2022-07-01 MED ORDER — FENTANYL CITRATE (PF) 100 MCG/2ML IJ SOLN
INTRAMUSCULAR | Status: DC | PRN
  Administered 2022-07-01: 25 ug via INTRAVENOUS
  Administered 2022-07-01: 50 ug via INTRAVENOUS

## 2022-07-01 MED ORDER — PROTAMINE SULFATE 10 MG/ML IV SOLN
INTRAVENOUS | Status: DC | PRN
  Administered 2022-07-01: 5 mg via INTRAVENOUS
  Administered 2022-07-01: 45 mg via INTRAVENOUS

## 2022-07-01 MED ORDER — IODIXANOL 320 MG/ML IV SOLN
INTRAVENOUS | Status: DC | PRN
  Administered 2022-07-01: 105 mL

## 2022-07-01 MED ORDER — MIDAZOLAM HCL 2 MG/2ML IJ SOLN
INTRAMUSCULAR | Status: AC
  Filled 2022-07-01: qty 2

## 2022-07-01 MED ORDER — LABETALOL HCL 5 MG/ML IV SOLN: 10.0000 mg | INTRAVENOUS | Status: DC | PRN

## 2022-07-01 MED ORDER — ONDANSETRON HCL 4 MG/2ML IJ SOLN: 4.0000 mg | Freq: Four times a day (QID) | INTRAMUSCULAR | Status: DC | PRN

## 2022-07-01 MED ORDER — ASPIRIN 81 MG PO CHEW
CHEWABLE_TABLET | ORAL | Status: DC | PRN
  Administered 2022-07-01: 81 mg via ORAL

## 2022-07-01 MED ORDER — HEPARIN (PORCINE) IN NACL 1000-0.9 UT/500ML-% IV SOLN
INTRAVENOUS | Status: DC | PRN
  Administered 2022-07-01 (×2): 500 mL

## 2022-07-01 MED ORDER — CLOPIDOGREL BISULFATE 300 MG PO TABS
ORAL_TABLET | ORAL | Status: DC | PRN
  Administered 2022-07-01: 300 mg via ORAL

## 2022-07-01 MED ORDER — SODIUM CHLORIDE 0.9 % IV SOLN: INTRAVENOUS | Status: DC

## 2022-07-01 MED ORDER — SODIUM CHLORIDE 0.9 % IV SOLN: 250.0000 mL | INTRAVENOUS | Status: DC | PRN

## 2022-07-01 MED ORDER — MIDAZOLAM HCL 2 MG/2ML IJ SOLN
INTRAMUSCULAR | Status: DC | PRN
  Administered 2022-07-01: 1 mg via INTRAVENOUS
  Administered 2022-07-01: 2 mg via INTRAVENOUS

## 2022-07-01 MED ORDER — SODIUM CHLORIDE 0.9% FLUSH: 3.0000 mL | INTRAVENOUS | Status: DC | PRN

## 2022-07-01 MED ORDER — ACETAMINOPHEN 325 MG PO TABS: 650.0000 mg | ORAL_TABLET | ORAL | Status: DC | PRN

## 2022-07-01 MED ORDER — FENTANYL CITRATE (PF) 100 MCG/2ML IJ SOLN
INTRAMUSCULAR | Status: AC
  Filled 2022-07-01: qty 2

## 2022-07-01 MED ORDER — ASPIRIN 81 MG PO TBEC: 81.0000 mg | DELAYED_RELEASE_TABLET | Freq: Every day | ORAL | 12 refills | Status: DC

## 2022-07-01 MED ORDER — HYDRALAZINE HCL 20 MG/ML IJ SOLN: 5.0000 mg | INTRAMUSCULAR | Status: DC | PRN

## 2022-07-01 MED ORDER — SODIUM CHLORIDE 0.9% FLUSH: 3.0000 mL | Freq: Two times a day (BID) | INTRAVENOUS | Status: DC

## 2022-07-01 MED ORDER — HEPARIN SODIUM (PORCINE) 1000 UNIT/ML IJ SOLN
INTRAMUSCULAR | Status: DC | PRN
  Administered 2022-07-01: 7000 [IU] via INTRAVENOUS

## 2022-07-01 MED ORDER — CLOPIDOGREL BISULFATE 75 MG PO TABS: 75.0000 mg | ORAL_TABLET | Freq: Every day | ORAL | 11 refills | Status: DC

## 2022-07-01 MED ORDER — CLOPIDOGREL BISULFATE 300 MG PO TABS
ORAL_TABLET | ORAL | Status: AC
  Filled 2022-07-01: qty 1

## 2022-07-01 MED ORDER — LIDOCAINE HCL (PF) 1 % IJ SOLN
INTRAMUSCULAR | Status: AC
  Filled 2022-07-01: qty 30

## 2022-07-01 MED ORDER — PROTAMINE SULFATE 10 MG/ML IV SOLN
INTRAVENOUS | Status: AC
  Filled 2022-07-01: qty 5

## 2022-07-01 MED ORDER — ASPIRIN 81 MG PO TBEC: 81.0000 mg | DELAYED_RELEASE_TABLET | Freq: Every day | ORAL | Status: DC

## 2022-07-01 MED ORDER — OXYCODONE HCL 5 MG PO TABS: 5.0000 mg | ORAL_TABLET | ORAL | Status: DC | PRN

## 2022-07-01 MED ORDER — SODIUM CHLORIDE 0.9 % WEIGHT BASED INFUSION: 1.0000 mL/kg/h | INTRAVENOUS | Status: DC

## 2022-07-01 MED ORDER — HEPARIN SODIUM (PORCINE) 1000 UNIT/ML IJ SOLN
INTRAMUSCULAR | Status: AC
  Filled 2022-07-01: qty 10

## 2022-07-01 MED ORDER — CLOPIDOGREL BISULFATE 75 MG PO TABS: 75.0000 mg | ORAL_TABLET | Freq: Every day | ORAL | Status: DC

## 2022-07-01 SURGICAL SUPPLY — 17 items
CATH OMNI FLUSH 5F 65CM (CATHETERS) IMPLANT
CLOSURE MYNX CONTROL 6F/7F (Vascular Products) IMPLANT
KIT ENCORE 26 ADVANTAGE (KITS) IMPLANT
KIT MICROPUNCTURE NIT STIFF (SHEATH) IMPLANT
KIT PV (KITS) ×2 IMPLANT
SHEATH CATAPULT 7FR 45 (SHEATH) IMPLANT
SHEATH PINNACLE 5F 10CM (SHEATH) IMPLANT
SHEATH PINNACLE 7F 10CM (SHEATH) IMPLANT
SHEATH PROBE COVER 6X72 (BAG) IMPLANT
STENT VIABAHN 39XCATH 80 (Permanent Stent) IMPLANT
STENT VIABAHN 8X39 7FR 80 (Permanent Stent) ×2 IMPLANT
STOPCOCK MORSE 400PSI 3WAY (MISCELLANEOUS) IMPLANT
SYR MEDRAD MARK 7 150ML (SYRINGE) ×2 IMPLANT
TRANSDUCER W/STOPCOCK (MISCELLANEOUS) ×2 IMPLANT
TRAY PV CATH (CUSTOM PROCEDURE TRAY) ×2 IMPLANT
TUBING CIL FLEX 10 FLL-RA (TUBING) IMPLANT
WIRE BENTSON .035X145CM (WIRE) IMPLANT

## 2022-07-01 NOTE — Interval H&P Note (Signed)
History and Physical Interval Note:  07/01/2022 8:18 AM  Vanessa Rojas  has presented today for surgery, with the diagnosis of pad w/ claudication right lower.  The various methods of treatment have been discussed with the patient and family. After consideration of risks, benefits and other options for treatment, the patient has consented to  Procedure(s): ABDOMINAL AORTOGRAM W/LOWER EXTREMITY (N/A) as a surgical intervention.  The patient's history has been reviewed, patient examined, no change in status, stable for surgery.  I have reviewed the patient's chart and labs.  Questions were answered to the patient's satisfaction.     Durene Cal

## 2022-07-01 NOTE — Progress Notes (Signed)
Patient and husband was given discharge instructions. Both verbalized unstanding.

## 2022-07-01 NOTE — Op Note (Signed)
    Patient name: Vanessa Rojas MRN: [id number] DOB: 05-Sep-1965 Sex: female  07/01/2022 Pre-operative Diagnosis: right leg claudication Post-operative diagnosis:  Same Surgeon:  Durene Cal Procedure Performed:  1.  Abdominal aortogram with bilateral runoff  2.  Right common iliac stent  3.  Conscious sedation, 36 minutes  4.  Closure device, Mynx   Indications: This is a 57 year old female with severe right leg claudication.  She did not have a palpable femoral pulse on exam.  She comes in today for angiogram and possible intervention  Procedure:  The patient was identified in the holding area and taken to room 8.  The patient was then placed supine on the table and prepped and draped in the usual sterile fashion.  A time out was called.  Conscious sedation was administered with the use of IV fentanyl and Versed under continuous physician and nurse monitoring.  Heart rate, blood pressure, and oxygen saturation were continuously monitored.  Total sedation time was 36 minutes.  Ultrasound was used to evaluate the left common femoral artery.  It was patent .  A digital ultrasound image was acquired.  A micropuncture needle was used to access the left common femoral artery under ultrasound guidance.  An 018 wire was advanced without resistance and a micropuncture sheath was placed.  The 018 wire was removed and a benson wire was placed.  The micropuncture sheath was exchanged for a 5 french sheath.  An omniflush catheter was advanced over the wire to the level of L-1.  An abdominal angiogram was obtained.  Next, the catheter was pulled out of the aortic bifurcation and bilateral runoff was performed. Findings:   Aortogram: No significant renal artery stenosis was visualized.  The infrarenal abdominal aorta is widely patent.  The left common and external iliac arteries are widely patent.  There is a high-grade common greater than 85% stenosis within the right common iliac artery which is focal.  The  right external iliac artery is widely patent.  The visualized portions of bilateral femoral arteries are widely patent.  Right Lower Extremity: The right common femoral, profundofemoral, and superficial femoral artery are widely patent.  The popliteal artery is widely patent.  There is three-vessel runoff  Left Lower Extremity: The left common femoral, profundofemoral, superficial femoral artery are widely patent.  The popliteal artery is widely patent.  There is three-vessel runoff  Intervention: After the above images were acquired the decision was made to proceed with intervention.  Using reflux catheter, a Bentson wire was advanced into the right common femoral artery.  Next, a 7 French 45 cm sheath was advanced into the right common iliac artery.  The patient was fully heparinized.  I selected a 8 x 30 VBX stent and deployed this across the lesion in the common iliac artery.  Completion imaging revealed resolution of the stenosis.  At this time, the patient had a palpable femoral pulse.  I was very satisfied with these results.  The long sheath exchanged out for short 7 French sheath in the groin was closed with a Mynx.  Complications.  Impression:  #1  High-grade, greater than 85% stenosis within proximal right common iliac artery, successfully treated using a Gore VBX 8 by  #2  No significant outflow disease bilaterally   V. Durene Cal, M.D., [facility] Vascular and Vein Specialists of [place] Office: [phone] Pager:  [phone]

## 2022-07-02 ENCOUNTER — Encounter (HOSPITAL_COMMUNITY): Payer: Self-pay | Admitting: Surgery

## 2022-07-03 ENCOUNTER — Encounter: Payer: Self-pay | Admitting: Family Medicine

## 2022-07-18 ENCOUNTER — Other Ambulatory Visit: Payer: Self-pay | Admitting: *Deleted

## 2022-07-18 DIAGNOSIS — I70211 Atherosclerosis of native arteries of extremities with intermittent claudication, right leg: Secondary | ICD-10-CM

## 2022-07-25 ENCOUNTER — Other Ambulatory Visit: Payer: Self-pay | Admitting: Family Medicine

## 2022-08-04 ENCOUNTER — Ambulatory Visit (INDEPENDENT_AMBULATORY_CARE_PROVIDER_SITE_OTHER)
Admission: RE | Admit: 2022-08-04 | Discharge: 2022-08-04 | Disposition: A | Discharge status: Home / Self Care | Payer: 59 | Source: Ambulatory Visit | Attending: Surgery | Admitting: Surgery

## 2022-08-04 ENCOUNTER — Encounter: Payer: Self-pay | Admitting: Surgery

## 2022-08-04 ENCOUNTER — Ambulatory Visit (HOSPITAL_COMMUNITY)
Admission: RE | Admit: 2022-08-04 | Discharge: 2022-08-04 | Disposition: A | Discharge status: Home / Self Care | Payer: 59 | Source: Ambulatory Visit | Attending: Surgery | Admitting: Surgery

## 2022-08-04 ENCOUNTER — Ambulatory Visit (INDEPENDENT_AMBULATORY_CARE_PROVIDER_SITE_OTHER): Payer: 59 | Admitting: Surgery

## 2022-08-04 VITALS — BP 154/81 | HR 60 | Temp 97.9°F | Resp 20 | Ht 60.0 in | Wt 148.0 lb

## 2022-08-04 DIAGNOSIS — I70211 Atherosclerosis of native arteries of extremities with intermittent claudication, right leg: Secondary | ICD-10-CM

## 2022-08-04 DIAGNOSIS — I70213 Atherosclerosis of native arteries of extremities with intermittent claudication, bilateral legs: Secondary | ICD-10-CM

## 2022-08-04 LAB — VAS US ABI WITH/WO TBI
Left ABI: 1.09
Right ABI: 1.04

## 2022-08-04 NOTE — Progress Notes (Signed)
Vascular and Vein Specialist of [place]  Patient name: Vanessa Rojas MRN: [id number] DOB: 01-22-1965 Sex: female   REASON FOR VISIT:    Follow up  HISOTRY OF PRESENT ILLNESS:    Vanessa Rojas is a 57 y.o. female who was initially evaluated by Dr. Arbie Cookey for right leg claudication.  I performed angiography on 07/01/2022.  She was found to have a 85% proximal right common iliac artery stenosis that was treated with a 8 x 30 VBX stent.  Tension.  She takes a statin for hypercholesterolemia.  She is a smoker.  Following stent placement, her symptoms have completely resolved.  She is trying to stop smoking   PAST MEDICAL HISTORY:   Past Medical History:  Diagnosis Date   Ankylosing spondylitis (HCC) 02/20/2019   Followed by rheumatologist Dr. Zenovia Jordan [facility] rheumatology, Humira, January 2021   B12 deficiency 03/09/2019   B12 level 291 replenish with shots then oral medication February 2021   Hypertension    Hypokalemia 05/24/2019     FAMILY HISTORY:   Family History  Problem Relation Age of Onset   Cancer Mother        lung, liver, breast   Cancer Father    Hyperlipidemia Father    Hypertension Father    Cancer Brother        colon   Diabetes Maternal Aunt    Diabetes Maternal Grandmother     SOCIAL HISTORY:   Social History   Tobacco Use   Smoking status: Every Day    Current packs/day: 0.75    Average packs/day: 0.7 packs/day for 42.7 years (32.0 ttl pk-yrs)    Types: Cigarettes    Start date: 11/15/1979   Smokeless tobacco: Never  Substance Use Topics   Alcohol use: No    Alcohol/week: 0.0 standard drinks of alcohol     ALLERGIES:   Allergies  Allergen Reactions   Penicillins Rash    Childhood allergy.   Wellbutrin [Bupropion] Nausea And Vomiting     CURRENT MEDICATIONS:   Current Outpatient Medications  Medication Sig Dispense Refill   amLODipine (NORVASC) 2.5 MG tablet Take 1 tablet (2.5 mg  total) by mouth daily. 90 tablet 1   aspirin EC 81 MG tablet Take 1 tablet (81 mg total) by mouth daily. Swallow whole. 30 tablet 12   clopidogrel (PLAVIX) 75 MG tablet Take 1 tablet (75 mg total) by mouth daily. 30 tablet 11   Cyanocobalamin (VITAMIN B-12 PO) Take 1 tablet by mouth in the morning.     famotidine (PEPCID) 40 MG tablet Take 1 tablet (40 mg total) by mouth daily. 90 tablet 1   HUMIRA PEN 40 MG/0.4ML PNKT Inject 40 mg into the skin every 14 (fourteen) days.      metoprolol tartrate (LOPRESSOR) 100 MG tablet TAKE 1/2 TABLET BY MOUTH TWICE DAILY 90 tablet 1   potassium chloride (KLOR-CON) 10 MEQ tablet TAKE TWO TABLETS BY MOUTH TWICE DAILY 120 tablet 0   rosuvastatin (CRESTOR) 10 MG tablet Take 1 tablet (10 mg total) by mouth daily. 90 tablet 1   No current facility-administered medications for this visit.    REVIEW OF SYSTEMS:   [X]  denotes positive finding, [ ]  denotes negative finding Cardiac  Comments:  Chest pain or chest pressure:    Shortness of breath upon exertion:    Short of breath when lying flat:    Irregular heart rhythm:        Vascular    Pain in calf,  thigh, or hip brought on by ambulation:    Pain in feet at night that wakes you up from your sleep:     Blood clot in your veins:    Leg swelling:         Pulmonary    Oxygen at home:    Productive cough:     Wheezing:         Neurologic    Sudden weakness in arms or legs:     Sudden numbness in arms or legs:     Sudden onset of difficulty speaking or slurred speech:    Temporary loss of vision in one eye:     Problems with dizziness:         Gastrointestinal    Blood in stool:     Vomited blood:         Genitourinary    Burning when urinating:     Blood in urine:        Psychiatric    Major depression:         Hematologic    Bleeding problems:    Problems with blood clotting too easily:        Skin    Rashes or ulcers:        Constitutional    Fever or chills:      PHYSICAL  EXAM:   Vitals:   08/04/22 1146  BP: (!) 154/81  Pulse: 60  Resp: 20  Temp: 97.9 F (36.6 C)  SpO2: 98%  Weight: 148 lb (67.1 kg)  Height: 5' (1.524 m)    GENERAL: The patient is a well-nourished female, in no acute distress. The vital signs are documented above. CARDIAC: There is a regular rate and rhythm.  VASCULAR: Palpable pedal pulses bilaterally PULMONARY: Non-labored respirations MUSCULOSKELETAL: There are no major deformities or cyanosis. NEUROLOGIC: No focal weakness or paresthesias are detected. SKIN: There are no ulcers or rashes noted. PSYCHIATRIC: The patient has a normal affect.  STUDIES:   I have reviewed the following: +-------+-----------+-----------+------------+------------+  ABI/TBIToday's ABIToday's TBIPrevious ABIPrevious TBI  +-------+-----------+-----------+------------+------------+  Right 1.04       0.81       0.82        0.61          +-------+-----------+-----------+------------+------------+  Left  1.09       0.78       1.04        0.84          +-------+--------  Right toe pressure: 132 Left toe pressure: 127  Patent right common iliac artery stent with increased velocity in the mid  segment ( 50-99% stenosis range), no plaque visualized.  Velocities were 246 cm/s     MEDICAL ISSUES:   PAD: The patient's symptoms of right leg claudication have completely resolved after treating her iliac lesion.  I stressed the importance of smoking cessation.  She will return in 6 months for repeat imaging    Vanessa Rojas, IV, MD, FACS [facility] Vein Specialists of [facility] [phone] Pager [phone]

## 2022-08-13 ENCOUNTER — Other Ambulatory Visit: Payer: Self-pay

## 2022-08-13 DIAGNOSIS — I70213 Atherosclerosis of native arteries of extremities with intermittent claudication, bilateral legs: Secondary | ICD-10-CM

## 2022-08-18 ENCOUNTER — Other Ambulatory Visit: Payer: Self-pay | Admitting: Family Medicine

## 2022-09-16 ENCOUNTER — Other Ambulatory Visit: Payer: Self-pay | Admitting: Family Medicine

## 2022-11-04 DIAGNOSIS — M1991 Primary osteoarthritis, unspecified site: Secondary | ICD-10-CM | POA: Diagnosis not present

## 2022-11-04 DIAGNOSIS — Z79899 Other long term (current) drug therapy: Secondary | ICD-10-CM | POA: Diagnosis not present

## 2022-11-04 DIAGNOSIS — E663 Overweight: Secondary | ICD-10-CM | POA: Diagnosis not present

## 2022-11-04 DIAGNOSIS — Z6829 Body mass index (BMI) 29.0-29.9, adult: Secondary | ICD-10-CM | POA: Diagnosis not present

## 2022-11-04 DIAGNOSIS — H209 Unspecified iridocyclitis: Secondary | ICD-10-CM | POA: Diagnosis not present

## 2022-11-04 DIAGNOSIS — M459 Ankylosing spondylitis of unspecified sites in spine: Secondary | ICD-10-CM | POA: Diagnosis not present

## 2022-11-11 ENCOUNTER — Other Ambulatory Visit: Payer: Self-pay | Admitting: Family Medicine

## 2022-11-18 DIAGNOSIS — Z79899 Other long term (current) drug therapy: Secondary | ICD-10-CM | POA: Diagnosis not present

## 2022-11-18 DIAGNOSIS — M459 Ankylosing spondylitis of unspecified sites in spine: Secondary | ICD-10-CM | POA: Diagnosis not present

## 2022-12-02 ENCOUNTER — Encounter: Payer: Self-pay | Admitting: Family Medicine

## 2022-12-02 ENCOUNTER — Ambulatory Visit (INDEPENDENT_AMBULATORY_CARE_PROVIDER_SITE_OTHER): Payer: 59 | Admitting: Family Medicine

## 2022-12-02 VITALS — BP 134/84 | HR 56 | Ht 60.0 in | Wt 151.2 lb

## 2022-12-02 DIAGNOSIS — F17200 Nicotine dependence, unspecified, uncomplicated: Secondary | ICD-10-CM | POA: Diagnosis not present

## 2022-12-02 DIAGNOSIS — R413 Other amnesia: Secondary | ICD-10-CM | POA: Diagnosis not present

## 2022-12-02 DIAGNOSIS — I10 Essential (primary) hypertension: Secondary | ICD-10-CM | POA: Diagnosis not present

## 2022-12-02 DIAGNOSIS — E785 Hyperlipidemia, unspecified: Secondary | ICD-10-CM | POA: Diagnosis not present

## 2022-12-02 DIAGNOSIS — I700 Atherosclerosis of aorta: Secondary | ICD-10-CM | POA: Diagnosis not present

## 2022-12-02 MED ORDER — AMLODIPINE BESYLATE 2.5 MG PO TABS: 2.5000 mg | ORAL_TABLET | Freq: Every day | ORAL | 1 refills | Status: DC

## 2022-12-02 MED ORDER — METOPROLOL TARTRATE 100 MG PO TABS: ORAL_TABLET | ORAL | 1 refills | Status: DC

## 2022-12-02 MED ORDER — POTASSIUM CHLORIDE ER 10 MEQ PO TBCR: EXTENDED_RELEASE_TABLET | ORAL | 1 refills | Status: AC

## 2022-12-02 MED ORDER — FAMOTIDINE 40 MG PO TABS: 40.0000 mg | ORAL_TABLET | Freq: Every day | ORAL | 1 refills | Status: DC

## 2022-12-02 MED ORDER — ROSUVASTATIN CALCIUM 10 MG PO TABS: 10.0000 mg | ORAL_TABLET | Freq: Every day | ORAL | 1 refills | Status: AC

## 2022-12-02 NOTE — Progress Notes (Signed)
Subjective:    Patient ID: Vanessa Rojas, female    DOB: 1965/06/18, 57 y.o.   MRN: [id number]  Discussed the use of AI scribe software for clinical note transcription with the patient, who gave verbal consent to proceed.  History of Present Illness   The patient, with a history of vascular issues and a recent stent placement, reports significant improvement in leg discomfort and cessation of foot cramps, previously attributed to low potassium levels. She has been taking potassium supplements twice daily, but given the improvement in symptoms, she expresses interest in reducing the dosage. The patient also reports no issues with breathing or mobility.  The patient acknowledges a smoking habit and rates her likelihood of quitting in the next six months as a 'five' on a scale of one to ten. She expresses a desire to quit due to the smell and the known health risks, but struggles with cravings.  The patient is also on cholesterol medication, which she suspects may be affecting her cognitive function. She reports instances of asking repeated questions and expresses concern about potential memory issues. She expresses interest in discontinuing the medication temporarily to assess if there is a correlation between the medication and her cognitive concerns.  The patient also mentions taking Humira for an unspecified condition, which she reports is working well. She has been seen by a rheumatologist recently and is awaiting results from recent blood work. The patient does not report any issues with blood pressure and maintains an active lifestyle, including regular walking for exercise.         Review of Systems     Objective:    Physical Exam   VITALS: BP- 134/84 CHEST: Breath sounds clear bilaterally CARDIOVASCULAR: Regular rate and rhythm, no murmurs, rubs, or gallops           Assessment & Plan:  Assessment and Plan    Hypokalemia No recent foot cramps since stent placement.  History of low potassium levels. -Reduce potassium supplementation from 2 tablets twice daily to 1 tablet twice daily.  Peripheral Vascular Disease Improved leg symptoms since stent placement. -Continue current management.  Hyperlipidemia Concerns about cognitive effects of statin therapy. -Trial discontinuation of statin for 60 days to assess for cognitive improvement. -Consider alternative cholesterol-lowering medications if cognitive effects are confirmed to be due to statin.  Tobacco Use Continued smoking with moderate motivation to quit. -Discussed potential cessation strategies including Chantix, Wellbutrin, nicotine patches, and gum. -Encouraged patient to consider smoking cessation as a priority for vascular health.  General Health Maintenance -Encouraged regular mild-intensity exercise such as walking. -Recommended mammogram, with the option of annual or biennial screening. -Advised consideration of flu and pneumonia vaccines. -Plan to follow up in the spring, with an interim update from the patient in January regarding the effects of statin discontinuation.      1. Hyperlipidemia, unspecified hyperlipidemia type Very important to keep cholesterol under good control but patient feels that the medication is causing her to have cognitive issues and difficult time remembering things she will try a 61-month trial off of the medicine if she sees a dramatic change consider other measures perhaps Repatha or Praluent or even a different statin  2. Primary hypertension Blood pressure decent control healthy diet quit smoking stay active  3. Aortic atherosclerosis (HCC) Keep LDL below 70 if possible stay on statin if possible see above  4. Smoker Patient strongly encouraged to quit smoking she is unlikely to do so currently  5. Memory change Associated  possibly with statin but it is hard to tell this could be coincidental and age-related no obvious sign of dementia  Will do more  extensive labs by springtime

## 2022-12-30 ENCOUNTER — Encounter: Payer: Self-pay | Admitting: Family Medicine

## 2022-12-30 NOTE — Telephone Encounter (Signed)
Nurses It is more likely that this is a possible nerve impingement in the neck causing pain down the arm Less likely that this is a circulation issue I would recommend office visit with myself or with Toni Amend Grooms PA here at the office  (In regards to this-these type of troubles typically improve over several weeks but we would need to see her and examine her to be certain that this is a nerve impingement plus also the evidence of muscle weakness may need to do imaging sooner rather than later)

## 2023-01-01 ENCOUNTER — Encounter: Payer: Self-pay | Admitting: Physician Assistant

## 2023-01-01 ENCOUNTER — Ambulatory Visit (INDEPENDENT_AMBULATORY_CARE_PROVIDER_SITE_OTHER): Payer: 59 | Admitting: Physician Assistant

## 2023-01-01 ENCOUNTER — Ambulatory Visit (HOSPITAL_COMMUNITY)
Admission: RE | Admit: 2023-01-01 | Discharge: 2023-01-01 | Disposition: A | Discharge status: Home / Self Care | Payer: 59 | Source: Ambulatory Visit | Attending: Physician Assistant | Admitting: Physician Assistant

## 2023-01-01 VITALS — BP 126/82 | Ht 60.0 in | Wt 151.0 lb

## 2023-01-01 DIAGNOSIS — M79602 Pain in left arm: Secondary | ICD-10-CM

## 2023-01-01 DIAGNOSIS — M542 Cervicalgia: Secondary | ICD-10-CM

## 2023-01-01 DIAGNOSIS — M79603 Pain in arm, unspecified: Secondary | ICD-10-CM | POA: Diagnosis not present

## 2023-01-01 DIAGNOSIS — M47812 Spondylosis without myelopathy or radiculopathy, cervical region: Secondary | ICD-10-CM | POA: Diagnosis not present

## 2023-01-01 DIAGNOSIS — I6521 Occlusion and stenosis of right carotid artery: Secondary | ICD-10-CM | POA: Diagnosis not present

## 2023-01-01 MED ORDER — GABAPENTIN 100 MG PO CAPS: 100.0000 mg | ORAL_CAPSULE | Freq: Three times a day (TID) | ORAL | 3 refills | Status: AC

## 2023-01-01 NOTE — Progress Notes (Signed)
Established Patient Office Visit  Subjective   Patient ID: ICELA BLACKNALL, female    DOB: 1965-06-04  Age: 57 y.o. MRN: [id number]  Chief Complaint  Patient presents with   left arm, leg and neck pain    Same kind of ache she had in her leg before she had a stent put in    Patient presents today for left arm and neck pain that has been ongoing for approximately 3 weeks.  Patient has a pertinent medical history of peripheral vascular disease in right lower extremity with previous stenting procedure.  Patient reports she is experiencing a constant achiness in her left arm originating from left side of neck, that intermittently worsens.  She reports symptoms occasionally wake her up at night due to discomfort.  She states she frequently experiences weakness in her left arm and hand.  She relates that the symptoms feel similar to her previous right lower extremity claudication.  She is not experiencing any other symptoms such as numbness, sensory disturbances, paresthesias.  Patient does admit she continues to smoke at this time.     Review of Systems  Constitutional:  Negative for fever.  Respiratory: Negative.  Negative for shortness of breath.   Cardiovascular:  Negative for chest pain.  Musculoskeletal:  Positive for neck pain.  Neurological:  Positive for weakness. Negative for tingling and sensory change.      Objective:     BP 126/82   Ht 5' (1.524 m)   Wt 151 lb (68.5 kg)   LMP 05/09/2012   BMI 29.49 kg/m    Physical Exam Constitutional:      Appearance: Normal appearance.  HENT:     Nose: Nose normal.     Mouth/Throat:     Mouth: Mucous membranes are moist.     Pharynx: Oropharynx is clear.  Eyes:     Extraocular Movements: Extraocular movements intact.  Cardiovascular:     Rate and Rhythm: Normal rate and regular rhythm.     Pulses: Normal pulses.     Heart sounds: No murmur heard.    No friction rub. No gallop.  Pulmonary:     Effort: Pulmonary effort is  normal.     Breath sounds: Normal breath sounds. No stridor. No wheezing, rhonchi or rales.  Musculoskeletal:     Right shoulder: Normal. No tenderness. Normal range of motion. Normal pulse.     Left shoulder: Normal. No tenderness. Normal range of motion. Normal pulse.     Right upper arm: Normal. No tenderness.     Left upper arm: Normal. No tenderness.     Right wrist: No tenderness. Normal range of motion. Normal pulse.     Left wrist: No tenderness. Normal range of motion. Normal pulse.     Right hand: Normal sensation. Normal capillary refill. Normal pulse.     Left hand: Normal sensation. Normal capillary refill. Normal pulse.     Cervical back: Normal range of motion. No tenderness. No pain with movement, spinous process tenderness or muscular tenderness.  Skin:    General: Skin is warm and dry.     Capillary Refill: Capillary refill takes less than 2 seconds.  Neurological:     Mental Status: She is alert.     Sensory: Sensation is intact.     Motor: No weakness.     Coordination: Coordination normal.     Gait: Gait normal.     The ASCVD Risk score (Arnett DK, et al., 2019) failed to calculate  for the following reasons:   The valid total cholesterol range is 130 to 320 mg/dL    Assessment & Plan:   Return in about 4 weeks (around 01/29/2023) for arm pain follow up.   Pain in posterior left upper extremity -     DG Cervical Spine Complete; Future -     Gabapentin; Take 1 capsule (100 mg total) by mouth 3 (three) times daily.  Dispense: 90 capsule; Refill: 3  Neck pain on left side -     DG Cervical Spine Complete; Future  Patient appears well today.  Exam rather benign, normal and equal radial and brachial pulses, upper extremities warm and pink, equal blood pressures on right left arm.  I doubt symptoms are carpal tunnel related as patient relays entire arm is symptomatic and denies numbness and tingling.  With history of peripheral vascular disease, there is possibility  that this could be related to atherosclerosis, however extremity and hand are pink, warm, with normal pulses.  With history of neck pain and ankylosing spondylitis, there is possibility that this could be related to nerve impingement as well.  Cervical spine x-ray ordered today to evaluate cervical spine for any signs of nerve impingement.  Gabapentin ordered today for upper extremity pain relief.  Patient encouraged to follow-up with vascular to rule out upper extremity peripheral vascular disease.  Toni Amend Porfiria Heinrich, PA-C

## 2023-02-09 ENCOUNTER — Ambulatory Visit (HOSPITAL_COMMUNITY): Payer: Self-pay

## 2023-02-09 ENCOUNTER — Ambulatory Visit: Payer: 59 | Admitting: Surgery

## 2023-03-18 ENCOUNTER — Other Ambulatory Visit: Payer: Self-pay | Admitting: Acute Care

## 2023-03-18 DIAGNOSIS — F17210 Nicotine dependence, cigarettes, uncomplicated: Secondary | ICD-10-CM

## 2023-03-18 DIAGNOSIS — Z87891 Personal history of nicotine dependence: Secondary | ICD-10-CM

## 2023-03-18 DIAGNOSIS — Z122 Encounter for screening for malignant neoplasm of respiratory organs: Secondary | ICD-10-CM

## 2023-03-24 ENCOUNTER — Ambulatory Visit (INDEPENDENT_AMBULATORY_CARE_PROVIDER_SITE_OTHER): Payer: 59

## 2023-04-02 ENCOUNTER — Ambulatory Visit
Admission: RE | Admit: 2023-04-02 | Discharge: 2023-04-02 | Disposition: A | Discharge status: Home / Self Care | Payer: Self-pay | Source: Ambulatory Visit | Attending: Acute Care | Admitting: Acute Care

## 2023-04-02 DIAGNOSIS — Z87891 Personal history of nicotine dependence: Secondary | ICD-10-CM

## 2023-04-02 DIAGNOSIS — Z122 Encounter for screening for malignant neoplasm of respiratory organs: Secondary | ICD-10-CM

## 2023-04-02 DIAGNOSIS — F17210 Nicotine dependence, cigarettes, uncomplicated: Secondary | ICD-10-CM

## 2023-05-05 ENCOUNTER — Other Ambulatory Visit: Payer: Self-pay

## 2023-05-05 DIAGNOSIS — F17210 Nicotine dependence, cigarettes, uncomplicated: Secondary | ICD-10-CM

## 2023-05-05 DIAGNOSIS — Z87891 Personal history of nicotine dependence: Secondary | ICD-10-CM

## 2023-05-05 DIAGNOSIS — Z122 Encounter for screening for malignant neoplasm of respiratory organs: Secondary | ICD-10-CM

## 2023-05-30 ENCOUNTER — Emergency Department (HOSPITAL_COMMUNITY)
Admission: EM | Admit: 2023-05-30 | Discharge: 2023-05-30 | Disposition: A | Discharge status: Home / Self Care | Attending: Emergency Medicine | Admitting: Emergency Medicine

## 2023-05-30 ENCOUNTER — Encounter (HOSPITAL_COMMUNITY): Payer: Self-pay | Admitting: *Deleted

## 2023-05-30 ENCOUNTER — Other Ambulatory Visit: Payer: Self-pay

## 2023-05-30 ENCOUNTER — Emergency Department (HOSPITAL_COMMUNITY)

## 2023-05-30 DIAGNOSIS — S93602A Unspecified sprain of left foot, initial encounter: Secondary | ICD-10-CM | POA: Insufficient documentation

## 2023-05-30 DIAGNOSIS — Y9229 Other specified public building as the place of occurrence of the external cause: Secondary | ICD-10-CM | POA: Diagnosis not present

## 2023-05-30 DIAGNOSIS — M7989 Other specified soft tissue disorders: Secondary | ICD-10-CM | POA: Diagnosis not present

## 2023-05-30 DIAGNOSIS — X501XXA Overexertion from prolonged static or awkward postures, initial encounter: Secondary | ICD-10-CM | POA: Diagnosis not present

## 2023-05-30 DIAGNOSIS — Z7982 Long term (current) use of aspirin: Secondary | ICD-10-CM | POA: Diagnosis not present

## 2023-05-30 DIAGNOSIS — Z794 Long term (current) use of insulin: Secondary | ICD-10-CM | POA: Insufficient documentation

## 2023-05-30 DIAGNOSIS — S99922A Unspecified injury of left foot, initial encounter: Secondary | ICD-10-CM | POA: Diagnosis present

## 2023-05-30 MED ORDER — HYDROCODONE-ACETAMINOPHEN 5-325 MG PO TABS: 1.0000 | ORAL_TABLET | Freq: Four times a day (QID) | ORAL | 0 refills | Status: DC | PRN

## 2023-05-30 MED ORDER — LIDOCAINE 5 % EX PTCH
1.0000 | MEDICATED_PATCH | CUTANEOUS | Status: DC
  Administered 2023-05-30: 1 via TRANSDERMAL
  Filled 2023-05-30: qty 1

## 2023-05-30 MED ORDER — HYDROCODONE-ACETAMINOPHEN 5-325 MG PO TABS: 1.0000 | ORAL_TABLET | Freq: Four times a day (QID) | ORAL | 0 refills | Status: AC | PRN

## 2023-05-30 NOTE — Discharge Instructions (Signed)
 Wear the boot to protect your injury as it continues to heal.   Use ice and elevation as much as possible for the next several days to help reduce the swelling, you may add a heating pad for 20 minutes several times daily starting on Tuesday.  You may take the hydrocodone prescribed if needed for extra pain relief.   This will make you drowsy - do not drive within 4 hours of taking this medication.  Call your primary doctor for a recheck of your injury in 1 week if not improving.  You may benefit from physical therapy of your ankle if it is not improving over the next week, but be aware it can take several weeks for a significant sprain to completely be pain free.

## 2023-05-30 NOTE — ED Notes (Signed)
 Pt ambulated to ED lobby with CAM boot and family after receiving discharge instructions.

## 2023-05-30 NOTE — ED Provider Notes (Signed)
 [name] Provider Note   CSN: [id number] Arrival date & time: 05/30/23  1040     History {Add pertinent medical, surgical, social history, OB history to HPI:1} Chief Complaint  Patient presents with  . Foot Injury    Vanessa Rojas is a 58 y.o. female   The history is provided by the patient.       Home Medications Prior to Admission medications   Medication Sig Start Date End Date Taking? Authorizing Provider  amLODipine  (NORVASC ) 2.5 MG tablet Take 1 tablet (2.5 mg total) by mouth daily. 12/02/22   Bennet Brasil, MD  aspirin  EC 81 MG tablet Take 1 tablet (81 mg total) by mouth daily. Swallow whole. 07/01/22   Margherita Shell, MD  clopidogrel  (PLAVIX ) 75 MG tablet Take 1 tablet (75 mg total) by mouth daily. 07/01/22   Margherita Shell, MD  Cyanocobalamin  (VITAMIN B-12 PO) Take 1 tablet by mouth in the morning.    [provider]  famotidine  (PEPCID ) 40 MG tablet Take 1 tablet (40 mg total) by mouth daily. 12/02/22   Bennet Brasil, MD  gabapentin  (NEURONTIN ) 100 MG capsule Take 1 capsule (100 mg total) by mouth 3 (three) times daily. 01/01/23   Grooms, Courtney, PA-C  HUMIRA PEN 40 MG/0.4ML PNKT Inject 40 mg into the skin every 14 (fourteen) days.  05/13/19   [provider]  HYDROcodone-acetaminophen  (NORCO/VICODIN) 5-325 MG tablet Take 1 tablet by mouth every 6 (six) hours as needed. 05/30/23   Vineet Kinney, PA-C  metoprolol  tartrate (LOPRESSOR ) 100 MG tablet TAKE 1/2 TABLET BY MOUTH TWICE DAILY 12/02/22   Bennet Brasil, MD  potassium chloride  (KLOR-CON ) 10 MEQ tablet 1 tablet twice daily 12/02/22   Bennet Brasil, MD  rosuvastatin  (CRESTOR ) 10 MG tablet Take 1 tablet (10 mg total) by mouth daily. 12/02/22   Bennet Brasil, MD      Allergies    Penicillins and Wellbutrin  [bupropion ]    Review of Systems   Review of Systems  Physical Exam Updated Vital Signs BP (!) 168/77   Pulse (!) 54   Temp 98  F (36.7 C) (Oral)   Resp 16   Ht 5' (1.524 m)   Wt 68 kg   LMP 05/09/2012   SpO2 100%   BMI 29.29 kg/m  Physical Exam  ED Results / Procedures / Treatments   Labs (all labs ordered are listed, but only abnormal results are displayed) Labs Reviewed - No data to display  EKG None  Radiology DG Foot Complete Left Result Date: 05/30/2023 CLINICAL DATA:  Pain after fall yesterday.  Swelling. EXAM: LEFT FOOT - COMPLETE 3+ VIEW COMPARISON:  None Available. FINDINGS: There is no evidence of fracture or dislocation. Minor dorsal spurring in the midfoot. No erosive change. Soft tissue edema overlies the dorsum of the foot. IMPRESSION: Soft tissue edema. No fracture or subluxation of the left foot. Electronically Signed   By: Chadwick Colonel M.D.   On: 05/30/2023 12:14    Procedures Procedures  {Document cardiac monitor, telemetry assessment procedure when appropriate:1}  Medications Ordered in ED Medications  lidocaine  (LIDODERM ) 5 % 1 patch (1 patch Transdermal Patch Applied 05/30/23 1344)    ED Course/ Medical Decision Making/ A&P   {   Click here for ABCD2, HEART and other calculatorsREFRESH Note before signing :1}  Medical Decision Making Amount and/or Complexity of Data Reviewed Radiology: ordered.  Risk Prescription drug management.   ***  {Document critical care time when appropriate:1} {Document review of labs and clinical decision tools ie heart score, Chads2Vasc2 etc:1}  {Document your independent review of radiology images, and any outside records:1} {Document your discussion with family members, caretakers, and with consultants:1} {Document social determinants of health affecting pt's care:1} {Document your decision making why or why not admission, treatments were needed:1} Final Clinical Impression(s) / ED Diagnoses Final diagnoses:  Sprain of left foot, initial encounter    Rx / DC Orders ED Discharge Orders           Ordered    HYDROcodone-acetaminophen  (NORCO/VICODIN) 5-325 MG tablet  Every 6 hours PRN,   Status:  Discontinued        05/30/23 1334    HYDROcodone-acetaminophen  (NORCO/VICODIN) 5-325 MG tablet  Every 6 hours PRN        05/30/23 1353

## 2023-05-30 NOTE — ED Notes (Signed)
 Pt left foot is swollen and bruised. Stated, "when I fell I thought I heard something crack."   Pt is able to move left foot slightly. Has been walking on it but just a little.

## 2023-05-30 NOTE — ED Triage Notes (Signed)
 Pt fell out of building yesterday and injured left foot. Swelling noted. Pt difficulty with ambulating due to pain. Denies hitting her head with fall.

## 2023-06-03 ENCOUNTER — Ambulatory Visit: Payer: 59 | Admitting: Family Medicine

## 2023-06-07 ENCOUNTER — Other Ambulatory Visit: Payer: Self-pay | Admitting: Surgery

## 2023-06-07 ENCOUNTER — Other Ambulatory Visit: Payer: Self-pay | Admitting: Family Medicine

## 2023-07-05 ENCOUNTER — Other Ambulatory Visit: Payer: Self-pay | Admitting: Surgery

## 2023-10-28 ENCOUNTER — Other Ambulatory Visit: Payer: Self-pay | Admitting: Family Medicine

## 2023-11-26 ENCOUNTER — Other Ambulatory Visit: Payer: Self-pay | Admitting: Family Medicine

## 2024-01-19 ENCOUNTER — Ambulatory Visit: Payer: Self-pay

## 2024-01-19 NOTE — Telephone Encounter (Signed)
 FYI Only or Action Required?: Action required by provider: request for appointment.  Patient was last seen in primary care on 01/01/2023 by Grooms, [place], [place].  Called Nurse Triage reporting Cough.  Symptoms began 2 days ago.  Interventions attempted: OTC medications: Mucinex, alka seltzer cough and cold, ibuprofen and Rest, hydration, or home remedies.  Symptoms are: gradually worsening.  Triage Disposition: Call PCP Within 24 Hours  Patient/caregiver understands and will follow disposition?: No, refuses disposition   Copied from CRM #[id number]. Topic: Clinical - Red Word Triage >> Jan 19, 2024  9:39 AM Vanessa Rojas wrote: Red Word that prompted transfer to Nurse Triage: may have flu- dehydration possible, not eating, severe body aches, coughing a lot, runny nose Reason for Disposition  Patient is HIGH RISK (e.g., 65 years and older, pregnant, HIV+, or chronic medical condition)  Answer Assessment - Initial Assessment Questions Requesting SDV to evaluate for flu. Symptoms began on Sunday with cough and progressing since.  No appointments are available, added to wait list, plan for urgent care evaluation if no appointments become available.    1. SYMPTOMS: What is your main symptom or concern? (e.g., cough, fever, shortness of breath, muscle aches)     Cough 2. ONSET: When did the symptoms start?      Two days ago on Sunday 3. COUGH: Do you have a cough? If Yes, ask: How bad is the cough?       yes 4. FEVER: Do you have a fever? If Yes, ask: What is your temperature, how was it measured, and when did it start?     Feels feverish 5. BREATHING DIFFICULTY: Are you having any difficulty breathing? (e.g., normal; shortness of breath, wheezing, unable to speak)      denies 6. BETTER-SAME-WORSE: Are you getting better, staying the same or getting worse compared to yesterday?  If getting worse, ask, In what way?     worse 7. OTHER SYMPTOMS: Do you have any other  symptoms?  (e.g., chills, fatigue, headache, loss of smell or taste, muscle pain, sore throat)     Vomited yesterday, poor appetite, congestion, runny nose, fatigue 8. INFLUENZA EXPOSURE: Was there any known exposure to influenza (flu) before the symptoms began?      unknown 9. INFLUENZA SUSPECTED: Why do you think you have influenza? (e.g., positive flu self-test at home, symptoms after exposure).     Symptoms. No testing has been done 10. INFLUENZA VACCINE: Have you had the flu vaccine? If Yes, ask: When did you last get it?        11 . HIGH RISK FOR COMPLICATIONS: Do you have any chronic medical problems? (e.g., asthma, heart or lung disease, obesity, weak immune system)       yes  Protocols used: Influenza (Flu) Suspected-A-AH

## 2024-01-19 NOTE — Telephone Encounter (Addendum)
 Per Dr Glendia- Recommend Urgent care- Patient notified

## 2024-01-22 ENCOUNTER — Other Ambulatory Visit: Payer: Self-pay | Admitting: Family Medicine
